# Patient Record
Sex: Male | Born: 1980 | Race: White | Hispanic: No | Smoking: Former smoker
Health system: Southern US, Community
[De-identification: ages and names within clinical notes are randomized; demographics above are authoritative.]

## PROBLEM LIST (undated history)

## (undated) DIAGNOSIS — F32A Depression, unspecified: Secondary | ICD-10-CM

## (undated) DIAGNOSIS — F329 Major depressive disorder, single episode, unspecified: Secondary | ICD-10-CM

## (undated) HISTORY — PX: OTHER SURGICAL HISTORY: SHX169

## (undated) HISTORY — DX: Depression, unspecified: F32.A

## (undated) HISTORY — DX: Major depressive disorder, single episode, unspecified: F32.9

---

## 2009-06-16 ENCOUNTER — Ambulatory Visit: Payer: Self-pay | Admitting: Internal Medicine

## 2009-06-16 DIAGNOSIS — I10 Essential (primary) hypertension: Secondary | ICD-10-CM | POA: Insufficient documentation

## 2009-06-16 DIAGNOSIS — Z87891 Personal history of nicotine dependence: Secondary | ICD-10-CM | POA: Insufficient documentation

## 2009-06-16 DIAGNOSIS — R5381 Other malaise: Secondary | ICD-10-CM | POA: Insufficient documentation

## 2009-06-16 DIAGNOSIS — F329 Major depressive disorder, single episode, unspecified: Secondary | ICD-10-CM | POA: Insufficient documentation

## 2009-06-16 DIAGNOSIS — R5383 Other fatigue: Secondary | ICD-10-CM

## 2009-06-16 DIAGNOSIS — F3289 Other specified depressive episodes: Secondary | ICD-10-CM | POA: Insufficient documentation

## 2009-06-16 DIAGNOSIS — Z9189 Other specified personal risk factors, not elsewhere classified: Secondary | ICD-10-CM | POA: Insufficient documentation

## 2009-06-16 DIAGNOSIS — J209 Acute bronchitis, unspecified: Secondary | ICD-10-CM

## 2009-06-16 DIAGNOSIS — J309 Allergic rhinitis, unspecified: Secondary | ICD-10-CM | POA: Insufficient documentation

## 2009-06-18 LAB — CONVERTED CEMR LAB
ALT: 52 units/L (ref 0–53)
Alkaline Phosphatase: 44 units/L (ref 39–117)
BUN: 7 mg/dL (ref 6–23)
Basophils Absolute: 0 10*3/uL (ref 0.0–0.1)
Basophils Relative: 0.7 % (ref 0.0–3.0)
Bilirubin, Direct: 0.2 mg/dL (ref 0.0–0.3)
CO2: 29 meq/L (ref 19–32)
Calcium: 10.2 mg/dL (ref 8.4–10.5)
Chloride: 100 meq/L (ref 96–112)
Creatinine, Ser: 1 mg/dL (ref 0.4–1.5)
Eosinophils Relative: 5.6 % — ABNORMAL HIGH (ref 0.0–5.0)
GFR calc non Af Amer: 93.62 mL/min (ref >60)
Lymphocytes Relative: 33.4 % (ref 12.0–46.0)
MCV: 90.3 fL (ref 78.0–100.0)
Monocytes Absolute: 0.5 10*3/uL (ref 0.1–1.0)
Neutrophils Relative %: 53.2 % (ref 43.0–77.0)
Platelets: 211 10*3/uL (ref 150.0–400.0)
RBC: 5.52 M/uL (ref 4.22–5.81)
Sodium: 142 meq/L (ref 135–145)
TSH: 3.09 microintl units/mL (ref 0.35–5.50)
Total Bilirubin: 0.8 mg/dL (ref 0.3–1.2)
Total Protein: 8 g/dL (ref 6.0–8.3)
WBC: 7 10*3/uL (ref 4.5–10.5)

## 2009-09-15 ENCOUNTER — Ambulatory Visit: Payer: Self-pay | Admitting: Family

## 2009-09-15 DIAGNOSIS — J029 Acute pharyngitis, unspecified: Secondary | ICD-10-CM

## 2009-09-15 DIAGNOSIS — K219 Gastro-esophageal reflux disease without esophagitis: Secondary | ICD-10-CM | POA: Insufficient documentation

## 2009-09-15 DIAGNOSIS — J329 Chronic sinusitis, unspecified: Secondary | ICD-10-CM | POA: Insufficient documentation

## 2009-09-15 LAB — CONVERTED CEMR LAB
Albumin: 4.5 g/dL (ref 3.5–5.2)
BUN: 12 mg/dL (ref 6–23)
Calcium: 9.7 mg/dL (ref 8.4–10.5)
Chloride: 102 meq/L (ref 96–112)
Cholesterol: 156 mg/dL (ref 0–200)
Eosinophils Absolute: 0.4 10*3/uL (ref 0.0–0.7)
Eosinophils Relative: 3 % (ref 0–5)
Glucose, Bld: 122 mg/dL — ABNORMAL HIGH (ref 70–99)
HCT: 47.2 % (ref 39.0–52.0)
HDL: 36 mg/dL — ABNORMAL LOW (ref >39)
Hemoglobin: 15.9 g/dL (ref 13.0–17.0)
Lymphocytes Relative: 25 % (ref 12–46)
Lymphs Abs: 2.7 10*3/uL (ref 0.7–4.0)
MCV: 85.7 fL (ref 78.0–100.0)
Monocytes Absolute: 0.5 10*3/uL (ref 0.1–1.0)
Potassium: 4.2 meq/L (ref 3.5–5.3)
RDW: 12.7 % (ref 11.5–15.5)
Rapid Strep: NEGATIVE
TSH: 2.205 microintl units/mL (ref 0.350–4.500)
Triglycerides: 171 mg/dL — ABNORMAL HIGH (ref <150)
WBC: 10.7 10*3/uL — ABNORMAL HIGH (ref 4.0–10.5)

## 2009-09-16 ENCOUNTER — Telehealth: Payer: Self-pay | Admitting: Family

## 2009-09-16 ENCOUNTER — Encounter: Payer: Self-pay | Admitting: Family

## 2009-09-16 DIAGNOSIS — R7309 Other abnormal glucose: Secondary | ICD-10-CM

## 2009-09-22 ENCOUNTER — Ambulatory Visit: Payer: Self-pay | Admitting: Family

## 2009-09-22 DIAGNOSIS — E119 Type 2 diabetes mellitus without complications: Secondary | ICD-10-CM | POA: Insufficient documentation

## 2009-09-22 LAB — CONVERTED CEMR LAB
Creatinine, Urine: 310.9 mg/dL
Microalb Creat Ratio: 6.9 mg/g (ref 0.0–30.0)

## 2009-09-23 ENCOUNTER — Encounter: Payer: Self-pay | Admitting: Family

## 2009-09-23 DIAGNOSIS — R809 Proteinuria, unspecified: Secondary | ICD-10-CM | POA: Insufficient documentation

## 2009-11-17 ENCOUNTER — Encounter: Payer: Self-pay | Admitting: Family

## 2009-12-02 ENCOUNTER — Telehealth: Payer: Self-pay | Admitting: Family

## 2009-12-03 ENCOUNTER — Ambulatory Visit: Payer: Self-pay | Admitting: Family

## 2009-12-03 LAB — CONVERTED CEMR LAB
Chloride: 102 meq/L (ref 96–112)
Creatinine, Ser: 0.87 mg/dL (ref 0.40–1.50)
Potassium: 4.1 meq/L (ref 3.5–5.3)
Sodium: 140 meq/L (ref 135–145)

## 2009-12-04 ENCOUNTER — Encounter: Payer: Self-pay | Admitting: Family

## 2009-12-29 ENCOUNTER — Ambulatory Visit: Payer: Self-pay | Admitting: Family

## 2010-03-11 ENCOUNTER — Encounter: Payer: Self-pay | Admitting: Family

## 2010-03-17 ENCOUNTER — Ambulatory Visit: Payer: Self-pay | Admitting: Family

## 2010-06-30 NOTE — Progress Notes (Signed)
Summary: f/u  Phone Note Outgoing Call   Summary of Call: Left message for patient to return my call.  When patient does call back I would like to inform him that his sugar is elevated.  He should arrange a follow up 30 minute visit please.  Initial call taken by: Lemont Fillers FNP,  September 16, 2009 3:09 PM  Follow-up for Phone Call        Patient returned my call.  Discussed lab results and new diagnosis of DM.  Please call patient the afternoon and arrange a follow up appointment (30 minutes) to discuss diabetes.   Follow-up by: Lemont Fillers FNP,  September 16, 2009 3:39 PM  Additional Follow-up for Phone Call Additional follow up Details #1::        Spoke to pt. and scheduled f/u for 09/22/09 @ 11:15.  Mervin Kung CMA  September 16, 2009 5:05 PM

## 2010-06-30 NOTE — Assessment & Plan Note (Signed)
Summary: F/U TO DISCUSS DIABETES / TF,CMA   Vital Signs:  Patient profile:   29 year old male Height:      70 inches Weight:      229.50 pounds BMI:     33.05 Temp:     98.2 degrees F oral Pulse rate:   72 / minute Pulse rhythm:   regular Resp:     12 per minute BP sitting:   130 / 90  (right arm) Cuff size:   regular  Vitals Entered By: Ricardo Rodriguez CMA (September 22, 2009 11:21 AM) CC: room 6  Follow up to discuss diabetes.   Primary Care Provider:  [name]  CC:  room 6  Follow up to discuss diabetes..  History of Present Illness: Ricardo Rodriguez is a 30 year old male who presents today for follow up of his newly diagnosed DM2.  He reports that he has gained 40 pounds in the last 2 years and that his grandfather was diabetic.  He has discontinued soda from his diet after learning that he was diabetic.  Exercises regularly (hikes).    Allergies (verified): No Known Drug Allergies  Physical Exam  General:  Well-developed,well-nourished,in no acute distress; alert,appropriate and cooperative throughout examination Lungs:  Normal respiratory effort, chest expands symmetrically. Lungs are clear to auscultation, no crackles or wheezes. Heart:  Normal rate and regular rhythm. S1 and S2 normal without gallop, murmur, click, rub or other extra sounds.   Impression & Recommendations:  Problem # 1:  DM (ICD-250.00) Assessment New 30 minutes spent with patient.  Greater than 50% of this time was spent counselling patient on diabetic care, diet, weight loss and exercise.  He was provided with educational materials on DM and DM meal planning.  Wishes to defer referral to nutrition and podiatry at this time.  Pt was counselled on signs/symtoms of hypoglycemia.  CMA instructed patient on glucose meter use. Will initiate metform and ACE.  Plan follow up in 1 month Declines referral to nutritionist.   Pt will schedule eye exam.  Wants to wait on podiatry referral.   His updated  medication list for this problem includes:    Metformin Hcl 500 Mg Tabs (Metformin hcl) ..... One tab by mouth daily for 1 week, then increase to one tablet by mouth two times a day    Lisinopril 10 Mg Tabs (Lisinopril) ..... One tab by mouth daily  Orders: T-Urine Microalbumin w/creat. ratio [id number])  Problem # 2:  ELEVATED BLOOD PRESSURE (ICD-796.2) Assessment: New BP is elevated.  Pt counselled on low sodium diet.  Will add ace for BP control and renal protection in setting of DM2.   His updated medication list for this problem includes:    Lisinopril 10 Mg Tabs (Lisinopril) ..... One tab by mouth daily  Complete Medication List: 1)  Prilosec Otc 20 Mg Tbec (Omeprazole magnesium) .... Once daily 2)  Amoxicillin 500 Mg Cap (Amoxicillin) .... Take 1 capsule by mouth three times a day x 10 days 3)  Metformin Hcl 500 Mg Tabs (Metformin hcl) .... One tab by mouth daily for 1 week, then increase to one tablet by mouth two times a day 4)  Lisinopril 10 Mg Tabs (Lisinopril) .... One tab by mouth daily  Patient Instructions: 1)  Please check your sugars once daily.   2)  Aim for fasting glucose of 80-110 and a glucose less than 150 after meals.  3)  Call if you start running sugars less than 80  and we can adjust your medication.  If you do have a low sugar- drink a glass of juice and recheck sugar in 30 minutes.   4)  Please follow up in 1 month.   Prescriptions: LISINOPRIL 10 MG TABS (LISINOPRIL) one tab by mouth daily  #30 x 1   Entered and Authorized by:   Lemont Fillers FNP   Signed by:   Lemont Fillers FNP on 09/22/2009   Method used:   [facility] to        [facility] [phone]* (retail)       [address]       [place], [place]  [address]       Ph: [phone]       Fax: [phone]   RxID:   [id number] METFORMIN HCL 500 MG TABS (METFORMIN HCL) one tab by mouth daily for 1 week, then increase to one tablet by mouth two  times a day  #60 x 1   Entered and Authorized by:   Lemont Fillers FNP   Signed by:   Lemont Fillers FNP on 09/22/2009   Method used:   [facility]ronically to        [facility] [phone]* (retail)       [address]       [place], [place]  [address]       Ph: [phone]       Fax: [phone]   RxID:   [id number]   Current Allergies (reviewed today): No known allergies

## 2010-06-30 NOTE — Letter (Signed)
   [place] at [facility] [address] [place], [place]  [address]  [place] Phone: [phone]      December 04, 2009   Xzayvion Samet [address] [place], [place] [address]  RE:  LAB RESULTS  Dear  Mr. Bail,  The following is an interpretation of your most recent lab tests.  Please take note of any instructions provided or changes to medications that have resulted from your lab work.  ELECTROLYTES:  Good - no changes needed  KIDNEY FUNCTION TESTS:  Good - no changes needed    DIABETIC STUDIES:  Good - no changes needed Blood Glucose: 122   HgbA1C: 6.6   Microalbumin/Creatinine Ratio: 6.9      Sincerely Yours,    Lemont Fillers FNP

## 2010-06-30 NOTE — Assessment & Plan Note (Signed)
Summary: FU ON NEW MED PER [name]/[facility]   Vital Signs:  Patient profile:   30 year old male Height:      70 inches Weight:      223.25 pounds BMI:     32.15 O2 Sat:      97 % on Room air Temp:     98.0 degrees F oral Pulse rate:   62 / minute Pulse rhythm:   regular Resp:     16 per minute BP sitting:   118 / 90  (right arm) Cuff size:   large  Vitals Entered By: Glendell Docker CMA (December 03, 2009 8:03 AM)  O2 Flow:  Room air CC: Rm 5-Medication Follow up  Is Patient Diabetic? No Pain Assessment Patient in pain? no      Comments Medication follow up no concerns   Primary Care Provider:  [name]  CC:  Rm 5-Medication Follow up .  History of Present Illness: Mr Ricardo Rodriguez is a 30 year old male who presents for follow up today.  1) DM- fasting sugars about 100, 140 post-prandial. Tolerating metformin.  Did note that he had one episode of hypoglycemia which occurred after hiking when he had very little food to eat.   2) HTN-  watching sodium,  has lost 6 pounds since his last visit.  Preventive Screening-Counseling & Management  Alcohol-Tobacco     Smoking Status: quit  Allergies (verified): No Known Drug Allergies  Physical Exam  General:  Well-developed,well-nourished,in no acute distress; alert,appropriate and cooperative throughout examination Head:  Normocephalic and atraumatic without obvious abnormalities. No apparent alopecia or balding. Lungs:  Normal respiratory effort, chest expands symmetrically. Lungs are clear to auscultation, no crackles or wheezes. Heart:  Normal rate and regular rhythm. S1 and S2 normal without gallop, murmur, click, rub or other extra sounds.   Impression & Recommendations:  Problem # 1:  DM (ICD-250.00) Assessment Improved Clinically stable A1C has dropped from 6.6 to 6.5.  He is at goal- will continue to monitor.   Patient instructed to make appointment for eye exam. ( + microalbuminuria- on ACE) His updated  medication list for this problem includes:    Metformin Hcl 500 Mg Tabs (Metformin hcl) ..... One tab by mouth daily for 1 week, then increase to one tablet by mouth two times a day    Lisinopril 10 Mg Tabs (Lisinopril) ..... One tab by mouth daily  Orders: T-Hgb A1C [id number])  Labs Reviewed: Creat: 0.92 (09/15/2009)    Reviewed HgBA1c results: 6.6 (09/16/2009)  Problem # 2:  ELEVATED BLOOD PRESSURE (ICD-796.2) Assessment: Improved Will add HCTZ as DBP is not yet at goal and patient has microalbuminuria His updated medication list for this problem includes:    Lisinopril 10 Mg Tabs (Lisinopril) ..... One tab by mouth daily    Hydrochlorothiazide 25 Mg Tabs (Hydrochlorothiazide) ..... One tablet by mouth daily  BP today: 118/90 Prior BP: 130/90 (09/22/2009)  Labs Reviewed: Creat: 0.92 (09/15/2009) Chol: 156 (09/15/2009)   HDL: 36 (09/15/2009)   LDL: 86 (09/15/2009)   TG: 171 (09/15/2009)  Instructed in low sodium diet (DASH Handout) and behavior modification.    Orders: TLB-BMP (Basic Metabolic Panel-BMET) (80048-METABOL)  Complete Medication List: 1)  Prilosec Otc 20 Mg Tbec (Omeprazole magnesium) .... Once daily 2)  Metformin Hcl 500 Mg Tabs (Metformin hcl) .... One tab by mouth daily for 1 week, then increase to one tablet by mouth two times a day 3)  Lisinopril 10 Mg Tabs (Lisinopril) .Marland KitchenMarland KitchenMarland Kitchen  One tab by mouth daily 4)  Hydrochlorothiazide 25 Mg Tabs (Hydrochlorothiazide) .... One tablet by mouth daily  Patient Instructions: 1)  Please schedule an eye exam.   2)  Follow up in 1 month. 3)  Complete your labs downstairs today. Prescriptions: LISINOPRIL 10 MG TABS (LISINOPRIL) one tab by mouth daily  #30 x 1   Entered and Authorized by:   Lemont Fillers FNP   Signed by:   Lemont Fillers FNP on 12/03/2009   Method used:   Electronically to        [facility] [phone]* (retail)       [address]       [place], [place]   [address]       Ph: [phone]       Fax: [phone]   RxID:   [id number] METFORMIN HCL 500 MG TABS (METFORMIN HCL) one tab by mouth daily for 1 week, then increase to one tablet by mouth two times a day  #60 x 1   Entered and Authorized by:   Lemont Fillers FNP   Signed by:   Lemont Fillers FNP on 12/03/2009   Method used:   Electronically to        [facility] [phone]* (retail)       [address]       [place], [place]  [address]       Ph: [phone]       Fax: [phone]   RxID:   [id number] HYDROCHLOROTHIAZIDE 25 MG TABS (HYDROCHLOROTHIAZIDE) one tablet by mouth daily  #30 x 1   Entered and Authorized by:   Lemont Fillers FNP   Signed by:   Lemont Fillers FNP on 12/03/2009   Method used:   Electronically to        [facility] [phone]* (retail)       [address]       [place], [place]  [address]       Ph: [phone]       Fax: [phone]   RxID:   [id number]   Current Allergies (reviewed today): No known allergies

## 2010-06-30 NOTE — Miscellaneous (Signed)
  Clinical Lists Changes  Problems: Added new problem of MICROALBUMINURIA (ICD-791.0) 

## 2010-06-30 NOTE — Progress Notes (Signed)
Summary: appt scheduled   Phone Note Outgoing Call   Summary of Call: Please call patient and arrange a follow up visit.  We need to see how he is doing wil the medication changes that were made last visit.  Initial call taken by: Lemont Fillers FNP,  December 02, 2009 8:30 AM  Follow-up for Phone Call        PATIENT APPT SCHEDULED FOR 12-03-2009 @8AM   Roselle Locus  December 02, 2009 9:43 AM

## 2010-06-30 NOTE — Letter (Signed)
Summary: Generic Letter  [place] at [facility]  [address]   [place], [place] [address]   Phone: [phone]  Fax: [phone]    03/11/2010  Ricardo Rodriguez [address] [place], [place]  [address]  Dear [name],  Please call our office to schedule a follow up appointment at your earliest convenience.  We need to follow up on your diabetes and high blood pressure.  I hope that you are well.       Sincerely,   Sandford Craze FNP  Appended Document: Generic Letter Mailed letter 03/12/10

## 2010-06-30 NOTE — Assessment & Plan Note (Signed)
Summary: FOLLOW UP  /HEA--rm 5   Vital Signs:  Patient profile:   30 year old male Height:      70 inches Weight:      230 pounds BMI:     33.12 Temp:     98.1 degrees F oral Pulse rate:   84 / minute Pulse rhythm:   regular Resp:     16 per minute BP sitting:   120 / 92  (right arm) Cuff size:   large  Vitals Entered By: Mervin Kung CMA Duncan Dull) (March 17, 2010 3:59 PM) CC: Rm 5  follow up of diabetes and high blood pressure., Hypertension Management Is Patient Diabetic? Yes Pain Assessment Patient in pain? no      Comments Pt agrees all med doses and directions are correct.  Pt needs refills on all meds. Nicki Guadalajara Fergerson CMA Duncan Dull)  March 17, 2010 4:03 PM    Primary Care Provider:  Lemont Fillers FNP  CC:  Rm 5  follow up of diabetes and high blood pressure. and Hypertension Management.  History of Present Illness: Ricardo Rodriguez is a 30 year old male who presents today for follow up.  1.DM- has been checking sugars- in AM about 100 after meals 135. Has been eating pretty well- tough when he travels.  Has increased his water.  Toleating metformin, denies hypoglycemia  2.  HTN- notes + med compliance.  Hypertension History:      Positive major cardiovascular risk factors include diabetes.  Negative major cardiovascular risk factors include male age less than 14 years old and non-tobacco-user status.    Allergies (verified): No Known Drug Allergies  Physical Exam  General:  Well-developed,well-nourished,in no acute distress; alert,appropriate and cooperative throughout examination Head:  Normocephalic and atraumatic without obvious abnormalities. No apparent alopecia or balding. Eyes:  Fundoscopic exam notes no obvious retinal hemorrhages Lungs:  Normal respiratory effort, chest expands symmetrically. Lungs are clear to auscultation, no crackles or wheezes. Heart:  Normal rate and regular rhythm. S1 and S2 normal without gallop, murmur, click, rub or other  extra sounds.  Diabetes Management Exam:    Foot Exam (with socks and/or shoes not present):       Sensory-Monofilament:          Left foot: normal          Right foot: normal       Inspection:          Left foot: normal          Right foot: normal       Nails:          Left foot: normal          Right foot: normal    Eye Exam:       Eye Exam done here today          Results: normal    Impression & Recommendations:  Problem # 1:  DM (ICD-250.00) Assessment Unchanged Check A1C, continue ACE, will add baby aspirin for cardiac protection His updated medication list for this problem includes:    Metformin Hcl 500 Mg Tabs (Metformin hcl) ..... One tab by mouth daily for 1 week, then increase to one tablet by mouth two times a day    Lisinopril 10 Mg Tabs (Lisinopril) ..... One tab by mouth daily    Aspirin Ec 81 Mg Tbec (Aspirin) ..... One tablet by mouth once daily  Orders: TLB-BMP (Basic Metabolic Panel-BMET) (80048-METABOL) T-Hgb A1C ([id number])  Labs Reviewed: Creat:  0.87 (12/03/2009)    Reviewed HgBA1c results: 6.5 (12/03/2009)  6.6 (09/16/2009)  Problem # 2:  ELEVATED BLOOD PRESSURE (ICD-796.2) Assessment: Comment Only BP control is reasonable.  Continue current meds.   His updated medication list for this problem includes:    Lisinopril 10 Mg Tabs (Lisinopril) ..... One tab by mouth daily    Hydrochlorothiazide 25 Mg Tabs (Hydrochlorothiazide) ..... One tablet by mouth daily  Orders: TLB-BMP (Basic Metabolic Panel-BMET) (80048-METABOL)  BP today: 120/92 Prior BP: 118/90 (12/03/2009)  Labs Reviewed: Creat: 0.87 (12/03/2009) Chol: 156 (09/15/2009)   HDL: 36 (09/15/2009)   LDL: 86 (09/15/2009)   TG: 171 (09/15/2009)  Instructed in low sodium diet (DASH Handout) and behavior modification.    Complete Medication List: 1)  Prilosec Otc 20 Mg Tbec (Omeprazole magnesium) .... Once daily 2)  Metformin Hcl 500 Mg Tabs (Metformin hcl) .... One tab by mouth daily  for 1 week, then increase to one tablet by mouth two times a day 3)  Lisinopril 10 Mg Tabs (Lisinopril) .... One tab by mouth daily 4)  Hydrochlorothiazide 25 Mg Tabs (Hydrochlorothiazide) .... One tablet by mouth daily 5)  Aspirin Ec 81 Mg Tbec (Aspirin) .... One tablet by mouth once daily  Hypertension Assessment/Plan:      The patient's hypertensive risk group is category C: Target organ damage and/or diabetes.  His calculated 10 year risk of coronary heart disease is 4 %.  Today's blood pressure is 120/92.  His blood pressure goal is < 130/80.   Patient Instructions: 1)  Please schedule an appointment for an eye exam.  2)  Complete your blood work downstairs. 3)  Follow up in 3 months.   Orders Added: 1)  TLB-BMP (Basic Metabolic Panel-BMET) [80048-METABOL] 2)  T-Hgb A1C [[id number]] 3)  Est. Patient Level III [[address]]    Current Allergies (reviewed today): No known allergies

## 2010-06-30 NOTE — Letter (Signed)
Summary: Care Consideration Regarding Eye Exam/[employer]  Care Consideration Regarding Eye Exam/[employer]   Imported By: Lanelle Bal 12/18/2009 10:13:37  _____________________________________________________________________  External Attachment:    Type:   Image     Comment:   External Document

## 2010-06-30 NOTE — Assessment & Plan Note (Signed)
Summary: NEW TO BE EST/[facility]   Vital Signs:  Patient profile:   30 year old male Height:      70 inches Weight:      228 pounds BMI:     32.83 Temp:     98.4 degrees F oral Pulse rate:   72 / minute Pulse rhythm:   regular Resp:     12 per minute BP sitting:   130 / 84  (right arm) Cuff size:   large  Vitals Entered By: Mervin Kung CMA (September 15, 2009 10:04 AM) Is Patient Diabetic? No   Primary Care Provider:  [name]   History of Present Illness: Ricardo Rodriguez is a 30 year old male who presents today to establish care.    Preventative- last tetanus shot greater than 10 years ago.  +Exercise (hikes regularly) Diet overall good,  eats too much pizza.  Quit smoking in December.    Depression-  Lost job 2006 tried zoloft for a few months- felt "complacent" on this med, pt discontinued as a result.  Denies current depression.    Allergies (verified): No Known Drug Allergies  Past History:  Past Medical History: Last updated: 06/16/2009 Depression Allergic rhinitis  Past Surgical History: Last updated: 06/16/2009 none  Family History: Last updated: 09/15/2009 Family History of Alcoholism/Addiction (grandparent) Family History Diabetes 1st degree relative (grandparent) HTN--maternal grandfather  Mom- living healthy (smoker) Dad- living healthy Sister- healthy, chronic headaches, ?TIA No children - wife is pregnant  Social History: Last updated: 09/15/2009 Former Smoker, quit dec 2010 married, lives with spouse who is pregnant 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  Risk Factors: Smoking Status: quit (06/16/2009)  Family History: Reviewed history from 06/16/2009 and no changes required. Family History of Alcoholism/Addiction (grandparent) Family History Diabetes 1st degree relative (grandparent) HTN--maternal grandfather  Mom- living healthy (smoker) Dad- living healthy Sister-  healthy, chronic headaches, ?TIA No children - wife is pregnant  Social History: Reviewed history from 06/16/2009 and no changes required. 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  Review of Systems       Constitutional: Denies Fever ENT:  + nasal congestion (green) x 1 week, using neti pot.  Resp: Denies cough CV:  Denies Chest Pain GI:  Denies nausea or vomitting (vomitted x 1 2 weeks ago during a headache- none since) GU: Denies dysuria Lymphatic: Denies lymphadenopathy Musculoskeletal:  Denies muscle/joint pain Skin:  Denies Rashes Psychiatric: Denies current depression, denies anxiety Neuro: Denies numbness     Physical Exam  General:  Well-developed,well-nourished,in no acute distress; alert,appropriate and cooperative throughout examination Head:  Normocephalic and atraumatic without obvious abnormalities. No apparent alopecia or balding. Eyes:  PERRLA Ears:  External ear exam shows no significant lesions or deformities.  Otoscopic examination reveals clear canals, tympanic membranes are intact bilaterally without bulging, retraction, inflammation or discharge. Hearing is grossly normal bilaterally. Neck:  No deformities, masses, or tenderness noted. Lungs:  Normal respiratory effort, chest expands symmetrically. Lungs are clear to auscultation, no crackles or wheezes. Heart:  Normal rate and regular rhythm. S1 and S2 normal without gallop, murmur, click, rub or other extra sounds. Abdomen:  Bowel sounds positive,abdomen soft and non-tender without masses, organomegaly or hernias noted. Genitalia:  Testes bilaterally descended without nodularity, tenderness or masses. No scrotal masses or lesions. No penis lesions  or urethral discharge.circumcised.   Msk:  No deformity or scoliosis noted of thoracic or lumbar spine.   Neurologic:  No cranial  nerve deficits noted. Station and gait are normal. Plantar reflexes are down-going bilaterally. DTRs are symmetrical throughout. Sensory, motor and coordinative functions appear intact. Skin:  Intact without suspicious lesions or rashes Cervical Nodes:  No lymphadenopathy noted Psych:  Cognition and judgment appear intact. Alert and cooperative with normal attention span and concentration. No apparent delusions, illusions, hallucinations   Impression & Recommendations:  Problem # 1:  PREVENTIVE HEALTH CARE (ICD-V70.0) Assessment New Patient exercises regularly.  Pt was counselled on healthy eating and weight loss.  Plan for fasting lab work today.   Orders: T-Comprehensive Metabolic Panel [id number]) T-CBC w/Diff [phone] [phone]) T-TSH [id number]) T-Lipid Profile [phone])  Problem # 2:  SINUSITIS (ICD-473.9) Assessment: New Rapid strep is negative. Symptoms consistent with sinusitus.  Plan to treat with amoxicillin.  His updated medication list for this problem includes:    Amoxicillin 500 Mg Cap (Amoxicillin) .Marland Kitchen... Take 1 capsule by mouth three times a day x 10 days  Problem # 3:  GERD (ICD-530.81) Assessment: Improved Pt reports that GERD symptoms are well controlled on prilosec OTC, continue same. His updated medication list for this problem includes:    Prilosec Otc 20 Mg Tbec (Omeprazole magnesium) ..... Once daily  Complete Medication List: 1)  Prilosec Otc 20 Mg Tbec (Omeprazole magnesium) .... Once daily 2)  Amoxicillin 500 Mg Cap (Amoxicillin) .... Take 1 capsule by mouth three times a day x 10 days  Other Orders: Rapid Strep ([id number])  Patient Instructions: 1)  Please follow up in 6 months. 2)  Complete your lab work today. Prescriptions: AMOXICILLIN 500 MG CAP (AMOXICILLIN) Take 1 capsule by mouth three times a day X 10 days  #30 x 0   Entered and Authorized by:   Lemont Fillers FNP   Signed by:   Lemont Fillers FNP on 09/15/2009   Method used:    [facility] to        [facility] [phone]* (retail)       [address]       [place], [place]  [address]       Ph: [phone]       Fax: [phone]   RxID:   [id number]   Current Allergies (reviewed today): No known allergies      Vital Signs:  Patient Profile:   30 year old male Height:     70 inches (175.26 cm) Weight:      228 pounds BMI:     32.83 Temp:     98.4 degrees F oral Pulse rate:   72 / minute Pulse rhythm:   regular Resp:     12 per minute BP sitting:   130 / 84 Cuff size:   large                 Laboratory Results    Other Tests  Rapid Strep: negative  Kit Test Internal QC: Positive   (Normal Range: Negative)

## 2010-06-30 NOTE — Assessment & Plan Note (Signed)
Summary: NEW [employer] PT--COUGHING UP GREEN STUFF-SNEEZING -PKG/OFF--STC   Vital Signs:  Patient profile:   30 year old male Height:      69 inches (175.26 cm) Weight:      230.0 pounds (104.55 kg) BMI:     34.09 O2 Sat:      98 % on Room air Temp:     98.9 degrees F (37.17 degrees C) oral Pulse rate:   81 / minute BP sitting:   140 / 110  (left arm) Cuff size:   large  Vitals Entered By: Orlan Leavens (June 16, 2009 3:51 PM)  O2 Flow:  Room air CC: New patient/ Pt states he had flu month ago. still having ongoing cough, nasal and chest congestion. fatigue. Been taking nyquil at night, tylenol cold.  Is Patient Diabetic? No Pain Assessment Patient in pain? no        Primary Care Provider:  [name]  CC:  New patient/ Pt states he had flu month ago. still having ongoing cough, nasal and chest congestion. fatigue. Been taking nyquil at night, and tylenol cold. Marland Kitchen  History of Present Illness: new pt to me and our practice - here to est care  c/o feeling ill precipiatated by flu infection (self diagnosed) onset of symptoms was 1 month ago - fever and malaise initially, dry cough-  this gradually improved w/o intervention but has had resurgence of new ill symptoms in past 2 weeks symptoms now decribed as nasal congestion and yellow discharge (from nose and chest sputum) a/w generalized fatigue no sore throat, no headache, no rash symptoms not improved with use of OTC meds like nyquil and tylenol cold +seasonal allergies - but not usually in this time of year - sneezing and runny nose "all the time"  concerned about high BP readings -  denies HA or vision changes, no unilateral weakness  +15 lb weight gain in past 6 weeks since quitting smoking  Preventive Screening-Counseling & Management  Alcohol-Tobacco     Smoking Status: quit  Current Medications (verified): 1)  Prilosec Otc 20 Mg Tbec (Omeprazole Magnesium) .... Once Daily  Allergies (verified): No  Known Drug Allergies  Past History:  Past Medical History: Depression Allergic rhinitis  Past Surgical History: none  Family History: Family History of Alcoholism/Addiction (grandparent) Family History Diabetes 1st degree relative (grandparent)  Social History: Former Smoker, quit dec 2010 married, lives with spouse who is pregnant works as Environmental manager social alcohol use Smoking Status:  quit  Review of Systems       see HPI above. I have reviewed all other systems and they were negative.   Physical Exam  General:  alert, well-developed, well-nourished, and cooperative to examination.    Eyes:  vision grossly intact; pupils equal, round and reactive to light.  conjunctiva and lids normal.    Ears:  normal pinnae bilaterally, without erythema, swelling, or tenderness to palpation. TMs clear, without effusion, or cerumen impaction. Hearing grossly normal bilaterally  Nose:  no external deformity, no external erythema, no active bleeding or clots, no sinus percussion tenderness, and clear nasal discharge with mucosal pallor.   Mouth:  teeth and gums in good repair; mucous membranes moist, without lesions or ulcers. oropharynx clear without exudate, mild erythema, +PND.  Lungs:  normal respiratory effort, no intercostal retractions or use of accessory muscles; few rhonchil breath sounds bilaterally - no crackles and no wheezes.    Heart:  normal rate, regular rhythm, no murmur, and no rub. BLE  without edema. Msk:  No deformity or scoliosis noted of thoracic or lumbar spine.   Neurologic:  alert & oriented X3 and cranial nerves II-XII symetrically intact.  strength normal in all extremities, sensation intact to light touch, and gait normal. speech fluent without dysarthria or aphasia; follows commands with good comprehension.  Skin:  no rashes, vesicles, ulcers, or erythema. No nodules or irregularity to palpation.  Psych:  Oriented X3, memory intact for recent and remote, normally  interactive, good eye contact, not anxious appearing, not depressed appearing, and not agitated.   dysphoric affect.     Impression & Recommendations:  Problem # 1:  ACUTE BRONCHITIS (ICD-466.0) cover for atypicals -  afeb and normal O2 - congrats on being done smoking! Orders: Prescription Created Electronically [phone])  His updated medication list for this problem includes:    Azithromycin 250 Mg Tabs (Azithromycin) .Marland Kitchen... 2 tabs by mouth today, then 1 by mouth daily starting tomorrow  Problem # 2:  FATIGUE (ICD-780.79) like residula from infx - check labs r/o underlying med dz, esp with recent weight gain Orders: TLB-BMP (Basic Metabolic Panel-BMET) (80048-METABOL) TLB-CBC Platelet - w/Differential (85025-CBCD) TLB-Hepatic/Liver Function Pnl (80076-HEPATIC) TLB-TSH (Thyroid Stimulating Hormone) (84443-TSH)  Problem # 3:  ELEVATED BLOOD PRESSURE (ICD-796.2) manual recheck 140/100 - ?exac by recent decongest use - laso noted weight gain and dec phys activity  unknonw FH - may be genetic component treat for infx, minimize decongest use and recheck in 6-8 weeks to consider initiating med tx if still high labs as ordered above  BP today: 140/110  Instructed in low sodium diet (DASH Handout) and behavior modification.    Problem # 4:  ALLERGIC RHINITIS (ICD-477.9)  His updated medication list for this problem includes:    Claritin 10 Mg Tabs (Loratadine) .Marland Kitchen... 1 by mouth once daily  Complete Medication List: 1)  Prilosec Otc 20 Mg Tbec (Omeprazole magnesium) .... Once daily 2)  Azithromycin 250 Mg Tabs (Azithromycin) .... 2 tabs by mouth today, then 1 by mouth daily starting tomorrow 3)  Claritin 10 Mg Tabs (Loratadine) .Marland Kitchen.. 1 by mouth once daily  Patient Instructions: 1)  it was good to see you today.  2)  congrats on being done smoking! keep it up! 3)  antibiotics for your cough and congestion symptoms - Zpack - your prescription has been electronically submitted to your  pharmacy. Please take as directed. Contact our office if you believe you're having problems with the medication(s).  4)  also add daily Claritin to your regimen even though this is not the usual time of year for your allergies! 5)  test(s) ordered today - your results will be posted on the phone tree for review in 48-72 hours from the time of test completion; call [phone] and enter your 9 digit MRN (listed above on this page, just below your name); if any changes need to be made or there are abnormal results, you will be contacted directly.  6)  ok to take OTC medications for your cold symptoms but try to avoid medications that "decongest" as this may aggrevate your blood pressure 7)  Please schedule a follow-up appointment in 6-8 weeks to recheck blood pressure, sooner if problems.  Prescriptions: AZITHROMYCIN 250 MG TABS (AZITHROMYCIN) 2 tabs by mouth today, then 1 by mouth daily starting tomorrow  #6 x 0   Entered and Authorized by:   [name]   Signed by:   [name] on 06/16/2009   Method used:  Electronically to        [facility] [phone]* (retail)       [address]       [place], [place]  [address]       Ph: [phone]       Fax: [phone]   RxID:   [id number]

## 2010-06-30 NOTE — Progress Notes (Signed)
Summary: added Hgb A1C  Phone Note Outgoing Call   Summary of Call: Could we pls add on A1C to yesterdays labs (790.29) Initial call taken by: Lemont Fillers FNP,  September 16, 2009 11:14 AM  Follow-up for Phone Call        Spoke with Marylene Land @ [facility], [phone] and test has been added.  Mervin Kung CMA  September 16, 2009 12:03 PM   New Problems: HYPERGLYCEMIA, FASTING (ICD-790.29)   New Problems: HYPERGLYCEMIA, FASTING (ICD-790.29)

## 2010-10-30 ENCOUNTER — Encounter: Payer: Self-pay | Admitting: Family

## 2011-03-31 ENCOUNTER — Ambulatory Visit: Payer: Self-pay | Admitting: Family

## 2011-04-02 ENCOUNTER — Encounter: Payer: Self-pay | Admitting: Family

## 2011-04-02 ENCOUNTER — Ambulatory Visit (INDEPENDENT_AMBULATORY_CARE_PROVIDER_SITE_OTHER): Payer: Managed Care, Other (non HMO) | Admitting: Family

## 2011-04-02 ENCOUNTER — Other Ambulatory Visit: Payer: Self-pay | Admitting: Family

## 2011-04-02 VITALS — BP 126/90 | HR 90 | Temp 98.6°F | Resp 16 | Wt 215.0 lb

## 2011-04-02 DIAGNOSIS — Z23 Encounter for immunization: Secondary | ICD-10-CM

## 2011-04-02 DIAGNOSIS — R030 Elevated blood-pressure reading, without diagnosis of hypertension: Secondary | ICD-10-CM

## 2011-04-02 DIAGNOSIS — E119 Type 2 diabetes mellitus without complications: Secondary | ICD-10-CM

## 2011-04-02 LAB — BASIC METABOLIC PANEL
CO2: 27 mEq/L (ref 19–32)
Chloride: 105 mEq/L (ref 96–112)
Sodium: 142 mEq/L (ref 135–145)

## 2011-04-02 LAB — HEMOGLOBIN A1C: Mean Plasma Glucose: 128 mg/dL — ABNORMAL HIGH (ref <117)

## 2011-04-02 MED ORDER — LISINOPRIL 10 MG PO TABS: 10.0000 mg | ORAL_TABLET | Freq: Every day | ORAL | Status: DC

## 2011-04-02 NOTE — Patient Instructions (Signed)
Please complete your blood work prior to leaving today.  Follow up in 3 months.

## 2011-04-02 NOTE — Assessment & Plan Note (Signed)
He has not been compliant with meds. Flu shot given today. Plan pneumovax next visit.  Check A1C, resume metformin if A1C >7.0.

## 2011-04-02 NOTE — Progress Notes (Signed)
  Subjective:    Patient ID: Ricardo Rodriguez, male    DOB: [birth year], 30 y.o.   MRN: [id number]  HPI  Ricardo Rodriguez is a 30 yr old male who presents today with report of recent headache. He reports that last week he had a throbbing headache which is accompanied by nausea, vomitting.  Symptoms resolved completely.  Now wife is sick.    Dm2- pt is off of all meds.  Tells me that his dad was recently diagnosed with diabetes and renal failure.  He tells me that he has been working on a diabetic diet.    Review of Systems See HPI  No past medical history on file.  History   Social History  . Marital Status: Married    Spouse Name: N/A    Number of Children: N/A  . Years of Education: N/A   Occupational History  . Not on file.   Social History Main Topics  . Smoking status: Former Smoker    Quit date: 05/31/2008  . Smokeless tobacco: Never Used  . Alcohol Use: Not on file  . Drug Use: Not on file  . Sexually Active: Not on file   Other Topics Concern  . Not on file   Social History Narrative  . No narrative on file    No past surgical history on file.  No family history on file.  No Known Allergies  No current outpatient prescriptions on file prior to visit.    BP 126/90  Pulse 90  Temp(Src) 98.6 F (37 C) (Oral)  Resp 16  Wt 215 lb (97.523 kg)       Objective:   Physical Exam  Constitutional: He appears well-developed and well-nourished. No distress.  HENT:  Head: Normocephalic and atraumatic.  Cardiovascular: Normal rate and regular rhythm.   No murmur heard. Pulmonary/Chest: Effort normal and breath sounds normal. No respiratory distress. He has no wheezes. He has no rales. He exhibits no tenderness.  Musculoskeletal:       See diabetic foot exam.  Psychiatric: He has a normal mood and affect. His behavior is normal. Judgment and thought content normal.          Assessment & Plan:  Recent viral illness- resolved.

## 2011-04-02 NOTE — Assessment & Plan Note (Signed)
BP is not at goal.  Had microalbuminemia last year.  He stopped his BP meds.  I advised pt to restart aspirin and lisinopril.

## 2011-04-16 ENCOUNTER — Telehealth: Payer: Self-pay | Admitting: Family

## 2011-04-16 NOTE — Telephone Encounter (Signed)
Could you pls check with [facility] if he completed the lab work that was ordered on 11/2.  If not, please call patient and request that he return to complete lab work.

## 2011-04-19 NOTE — Telephone Encounter (Signed)
Spoke to [place] and she will fax results from 04/02/11.

## 2011-04-20 ENCOUNTER — Encounter: Payer: Self-pay | Admitting: Family

## 2011-04-20 NOTE — Telephone Encounter (Signed)
Results received and forwarded to Provider for review.  Please advise. 

## 2011-04-21 ENCOUNTER — Encounter: Payer: Self-pay | Admitting: Family

## 2011-05-14 ENCOUNTER — Ambulatory Visit: Payer: Managed Care, Other (non HMO) | Admitting: Family

## 2011-05-21 ENCOUNTER — Encounter: Payer: Self-pay | Admitting: *Deleted

## 2011-05-21 ENCOUNTER — Encounter: Payer: Self-pay | Admitting: Family

## 2011-05-21 ENCOUNTER — Ambulatory Visit (INDEPENDENT_AMBULATORY_CARE_PROVIDER_SITE_OTHER): Payer: Managed Care, Other (non HMO) | Admitting: Family

## 2011-05-21 ENCOUNTER — Telehealth: Payer: Self-pay | Admitting: *Deleted

## 2011-05-21 VITALS — BP 128/80 | HR 82 | Temp 98.1°F | Resp 18 | Wt 205.0 lb

## 2011-05-21 DIAGNOSIS — I10 Essential (primary) hypertension: Secondary | ICD-10-CM

## 2011-05-21 DIAGNOSIS — R030 Elevated blood-pressure reading, without diagnosis of hypertension: Secondary | ICD-10-CM

## 2011-05-21 MED ORDER — LISINOPRIL 10 MG PO TABS: 10.0000 mg | ORAL_TABLET | Freq: Every day | ORAL | Status: DC

## 2011-05-21 NOTE — Patient Instructions (Addendum)
Please complete your blood work prior to leaving.  Follow up in 3 months. 

## 2011-05-21 NOTE — Telephone Encounter (Signed)
Same Day Abstraction. 

## 2011-05-21 NOTE — Progress Notes (Signed)
  Subjective:    Patient ID: Ricardo Rodriguez, male    DOB: February 05, 1981, 30 y.o.   MRN: [id number]  HPI  Ricardo Rodriguez is a 30 yr old male who presents today for follow up of his HTN.  Last visit he was placed on lisinopril.  He denies CP, SOB or LE edema.  Since starting the lisinopril he reports that he feels good. He is running regularly and has been running further distances recently.  Tolerating lisinopril without cough.     Review of Systems See HPI  Past Medical History  Diagnosis Date  . Depression   . Allergic rhinitis     History   Social History  . Marital Status: Married    Spouse Name: N/A    Number of Children: N/A  . Years of Education: N/A   Occupational History  . photographer    Social History Main Topics  . Smoking status: Former Smoker    Quit date: 05/31/2008  . Smokeless tobacco: Never Used  . Alcohol Use: Yes     social use - rare ETO  . Drug Use: Not on file  . Sexually Active: Not on file   Other Topics Concern  . Not on file   Social History Narrative   Married, lives w/spouse [pregnant].Works as Art therapist.    Past Surgical History  Procedure Date  . Unremarkable     Family History  Problem Relation Age of Onset  . Alcohol abuse Other     grandparent  . Diabetes Other     grandparent  . Hypertension Maternal Grandfather   . Other Mother     tobacco abuse  . Migraines Sister   . Transient ischemic attack Sister     No Known Allergies  No current outpatient prescriptions on file prior to visit.    BP 128/80  Pulse 82  Temp(Src) 98.1 F (36.7 C) (Oral)  Resp 18  Wt 205 lb (92.987 kg)  SpO2 98%       Objective:   Physical Exam  Constitutional: He appears well-developed and well-nourished. No distress.  Cardiovascular: Normal rate and regular rhythm.   No murmur heard. Pulmonary/Chest: Effort normal and breath sounds normal. No respiratory distress. He has no wheezes. He has no rales. He exhibits no  tenderness.  Musculoskeletal: He exhibits no edema.  Skin: Skin is warm and dry. No erythema.  Psychiatric: He has a normal mood and affect. His behavior is normal. Judgment and thought content normal.          Assessment & Plan:

## 2011-05-22 ENCOUNTER — Encounter: Payer: Self-pay | Admitting: Family

## 2011-05-22 LAB — BASIC METABOLIC PANEL WITH GFR
CO2: 21 mEq/L (ref 19–32)
GFR, Est African American: >89 mL/min
Glucose, Bld: 86 mg/dL (ref 70–99)
Potassium: 3.5 mEq/L (ref 3.5–5.3)
Sodium: 139 mEq/L (ref 135–145)

## 2011-05-22 NOTE — Assessment & Plan Note (Signed)
Will plan to continue ACE. Obtain BMET today.  I encouraged him to keep up the good work with exercise.

## 2011-08-05 ENCOUNTER — Ambulatory Visit (INDEPENDENT_AMBULATORY_CARE_PROVIDER_SITE_OTHER): Payer: Managed Care, Other (non HMO) | Admitting: Internal Medicine

## 2011-08-05 ENCOUNTER — Encounter: Payer: Self-pay | Admitting: Internal Medicine

## 2011-08-05 VITALS — BP 100/80 | HR 70 | Temp 98.0°F | Resp 18

## 2011-08-05 DIAGNOSIS — J029 Acute pharyngitis, unspecified: Secondary | ICD-10-CM

## 2011-08-05 LAB — POCT RAPID STREP A (OFFICE): Rapid Strep A Screen: NEGATIVE

## 2011-08-05 MED ORDER — AMOXICILLIN 875 MG PO TABS: 875.0000 mg | ORAL_TABLET | Freq: Two times a day (BID) | ORAL | Status: AC

## 2011-08-05 NOTE — Progress Notes (Signed)
  Subjective:    Patient ID: Ricardo Rodriguez, male    DOB: 1981/01/24, 31 y.o.   MRN: [id number]  HPI Pt presents to clinic for evaluation of sore throat. Notes 2d h/o ST with pain on swallowing. No fever, chills, nasal congestion/drainage or difficulty with drooling or secretions. No sick exposures. No alleviating or exacerbating factors. No other complaints.  Past Medical History  Diagnosis Date  . Depression   . Allergic rhinitis    Past Surgical History  Procedure Date  . Unremarkable     reports that he quit smoking about 3 years ago. He has never used smokeless tobacco. He reports that he drinks alcohol. His drug history not on file. family history includes Alcohol abuse in his other; Diabetes in his other; Hypertension in his maternal grandfather; Migraines in his sister; Other in his mother; and Transient ischemic attack in his sister. No Known Allergies   Review of Systems see hpi     Objective:   Physical Exam  Nursing note and vitals reviewed. Constitutional: He appears well-developed and well-nourished. No distress.  HENT:  Head: Normocephalic and atraumatic.  Nose: Nose normal.  Mouth/Throat: Uvula is midline and mucous membranes are normal. Posterior oropharyngeal erythema present. No oropharyngeal exudate, posterior oropharyngeal edema or tonsillar abscesses.  Eyes: Conjunctivae are normal. No scleral icterus.  Neurological: He is alert.  Skin: He is not diaphoretic.  Psychiatric: He has a normal mood and affect.          Assessment & Plan:

## 2011-08-05 NOTE — Assessment & Plan Note (Signed)
Rapid strep obtained neg. No strep exposure and exam show mild erythema only. Suspect viral etiology currently however hold abx and begin if sx's do not improve after total duration of 7-8 days. Followup if no improvement or worsening.

## 2011-08-13 ENCOUNTER — Ambulatory Visit: Payer: Managed Care, Other (non HMO) | Admitting: Family

## 2011-09-20 ENCOUNTER — Other Ambulatory Visit: Payer: Self-pay | Admitting: Internal Medicine

## 2011-12-14 ENCOUNTER — Encounter: Payer: Self-pay | Admitting: Family

## 2011-12-14 ENCOUNTER — Other Ambulatory Visit: Payer: Self-pay | Admitting: Family

## 2011-12-14 ENCOUNTER — Ambulatory Visit (INDEPENDENT_AMBULATORY_CARE_PROVIDER_SITE_OTHER): Payer: Managed Care, Other (non HMO) | Admitting: Family

## 2011-12-14 VITALS — BP 120/90 | HR 91 | Temp 99.6°F | Resp 16 | Ht 70.0 in | Wt 217.1 lb

## 2011-12-14 DIAGNOSIS — R6882 Decreased libido: Secondary | ICD-10-CM

## 2011-12-14 DIAGNOSIS — E119 Type 2 diabetes mellitus without complications: Secondary | ICD-10-CM

## 2011-12-14 DIAGNOSIS — I10 Essential (primary) hypertension: Secondary | ICD-10-CM

## 2011-12-14 NOTE — Progress Notes (Signed)
  Subjective:    Patient ID: Ricardo Rodriguez, male    DOB: 1981-05-09, 31 y.o.   MRN: [id number]  HPI  Ricardo Rodriguez is a 31 yr old male who presents today requesting to have his testosterone level checked.  He reports that he thinks he has some low level depression which he attributes to situational stress with his job.  He is not worried about the depression at this time.  Thinks his libido may be low and tells me "my wife is just curious" (about testosterone level).  Borderline DM- pt has gained 12 pounds since last visit.  HTN- continues lisinopril.    Review of Systems See HPI  Past Medical History  Diagnosis Date  . Depression   . Allergic rhinitis     History   Social History  . Marital Status: Married    Spouse Name: N/A    Number of Children: N/A  . Years of Education: N/A   Occupational History  . photographer    Social History Main Topics  . Smoking status: Former Smoker    Quit date: 05/31/2008  . Smokeless tobacco: Never Used  . Alcohol Use: Yes     social use - rare ETO  . Drug Use: Not on file  . Sexually Active: Not on file   Other Topics Concern  . Not on file   Social History Narrative   Married, lives w/spouse [pregnant].Works as Art therapist.    Past Surgical History  Procedure Date  . Unremarkable     Family History  Problem Relation Age of Onset  . Alcohol abuse Other     grandparent  . Diabetes Other     grandparent  . Hypertension Maternal Grandfather   . Other Mother     tobacco abuse  . Migraines Sister   . Transient ischemic attack Sister     No Known Allergies  Current Outpatient Prescriptions on File Prior to Visit  Medication Sig Dispense Refill  . lisinopril (PRINIVIL,ZESTRIL) 10 MG tablet Take 1 tablet (10 mg total) by mouth daily.  30 tablet  3  . omeprazole (PRILOSEC OTC) 20 MG tablet Take 20 mg by mouth daily.          BP 120/90  Pulse 91  Temp 99.6 F (37.6 C) (Oral)  Resp 16  Ht 5\' 10"   (1.778 m)  Wt 217 lb 1.3 oz (98.467 kg)  BMI 31.15 kg/m2  SpO2 97%       Objective:   Physical Exam  Constitutional: He appears well-developed and well-nourished. No distress.  Cardiovascular: Normal rate.   No murmur heard. Pulmonary/Chest: Effort normal and breath sounds normal.  Psychiatric: He has a normal mood and affect. His behavior is normal. Judgment and thought content normal.          Assessment & Plan:

## 2011-12-14 NOTE — Assessment & Plan Note (Signed)
Obtain A1C. 

## 2011-12-14 NOTE — Assessment & Plan Note (Signed)
Check testosterone free and total.

## 2011-12-14 NOTE — Assessment & Plan Note (Signed)
Fair BP control.  Continue ACE. Check BMET.

## 2011-12-14 NOTE — Patient Instructions (Addendum)
Please complete your lab work prior to leaving. Please schedule a follow up appointment in 3 months.  

## 2011-12-15 ENCOUNTER — Telehealth: Payer: Self-pay | Admitting: Family

## 2011-12-15 DIAGNOSIS — R7989 Other specified abnormal findings of blood chemistry: Secondary | ICD-10-CM

## 2011-12-15 LAB — HEMOGLOBIN A1C
Hgb A1c MFr Bld: 6.2 % — ABNORMAL HIGH (ref <5.7)
Mean Plasma Glucose: 131 mg/dL — ABNORMAL HIGH (ref <117)

## 2011-12-15 LAB — BASIC METABOLIC PANEL
BUN: 10 mg/dL (ref 6–23)
CO2: 29 mEq/L (ref 19–32)
Glucose, Bld: 123 mg/dL — ABNORMAL HIGH (ref 70–99)
Potassium: 4.4 mEq/L (ref 3.5–5.3)
Sodium: 140 mEq/L (ref 135–145)

## 2011-12-15 LAB — TESTOSTERONE, FREE, TOTAL, SHBG: Testosterone: 185.57 ng/dL — ABNORMAL LOW (ref 300–890)

## 2011-12-15 MED ORDER — TESTOSTERONE 30 MG/ACT TD SOLN: 60.0000 mg | Freq: Every day | TRANSDERMAL | Status: DC

## 2011-12-15 NOTE — Telephone Encounter (Signed)
Pls call pt and let him know that his testosterone is low.  I think he could benefit from testosterone replacement.  I have sent axiron to pharmacy.  He should apply one pump under arms after he applies deodorant once daily.  He should not allow his wife or kids to come in contact with the medication or the area of his skin where he applied.  He should plan to follow up in 3 months.  Can lab add on PSA (v70) to yesterday's labs?  If not, please ask pt to return to lab for PSA prior to starting med.

## 2011-12-16 NOTE — Telephone Encounter (Signed)
PSA level added per Mindy at [facility]. Left message for pt to return my call.

## 2011-12-16 NOTE — Telephone Encounter (Signed)
Pt returned my call and was notified of results below. Follow up has been scheduled for 03/17/12 at 8am.

## 2011-12-17 ENCOUNTER — Telehealth: Payer: Self-pay | Admitting: *Deleted

## 2011-12-17 DIAGNOSIS — Z0000 Encounter for general adult medical examination without abnormal findings: Secondary | ICD-10-CM

## 2011-12-17 NOTE — Telephone Encounter (Signed)
Received call from lab stating they are unable to add PSA onto previous blood work as quantity was not sufficient. Spoke with pt and he will completed lab work when he picks up [employer] today. Order entered and forwarded to the lab.

## 2012-03-17 ENCOUNTER — Telehealth: Payer: Self-pay | Admitting: Family

## 2012-03-17 ENCOUNTER — Ambulatory Visit: Payer: Managed Care, Other (non HMO) | Admitting: Family

## 2012-03-17 NOTE — Telephone Encounter (Signed)
You had an appt for testosterone for this patient at 8am on 10-18.  We had to cancel the appt.  Please follow up with the patient as I have been unable to reach him.

## 2012-04-04 NOTE — Telephone Encounter (Signed)
Attempted to reach pt by phone, # not in service. Mailed contact letter.

## 2012-07-15 ENCOUNTER — Other Ambulatory Visit: Payer: Self-pay

## 2012-07-18 ENCOUNTER — Encounter: Payer: Self-pay | Admitting: Family

## 2012-07-18 ENCOUNTER — Ambulatory Visit (INDEPENDENT_AMBULATORY_CARE_PROVIDER_SITE_OTHER): Payer: Managed Care, Other (non HMO) | Admitting: Family

## 2012-07-18 VITALS — BP 126/82 | HR 76 | Temp 98.6°F | Resp 16 | Ht 70.0 in | Wt 207.1 lb

## 2012-07-18 DIAGNOSIS — E119 Type 2 diabetes mellitus without complications: Secondary | ICD-10-CM

## 2012-07-18 DIAGNOSIS — Z0000 Encounter for general adult medical examination without abnormal findings: Secondary | ICD-10-CM | POA: Insufficient documentation

## 2012-07-18 DIAGNOSIS — E291 Testicular hypofunction: Secondary | ICD-10-CM

## 2012-07-18 DIAGNOSIS — R7309 Other abnormal glucose: Secondary | ICD-10-CM

## 2012-07-18 DIAGNOSIS — I10 Essential (primary) hypertension: Secondary | ICD-10-CM

## 2012-07-18 DIAGNOSIS — R739 Hyperglycemia, unspecified: Secondary | ICD-10-CM

## 2012-07-18 DIAGNOSIS — R7989 Other specified abnormal findings of blood chemistry: Secondary | ICD-10-CM

## 2012-07-18 LAB — CBC WITH DIFFERENTIAL/PLATELET
Basophils Relative: 0 % (ref 0–1)
Lymphocytes Relative: 37 % (ref 12–46)
Lymphs Abs: 2.1 10*3/uL (ref 0.7–4.0)
MCV: 81.4 fL (ref 78.0–100.0)
Neutro Abs: 3.1 10*3/uL (ref 1.7–7.7)
Neutrophils Relative %: 54 % (ref 43–77)
Platelets: 213 10*3/uL (ref 150–400)
RBC: 5.27 MIL/uL (ref 4.22–5.81)
RDW: 13.8 % (ref 11.5–15.5)
WBC: 5.8 10*3/uL (ref 4.0–10.5)

## 2012-07-18 LAB — BASIC METABOLIC PANEL WITH GFR
CO2: 29 mEq/L (ref 19–32)
Calcium: 9.3 mg/dL (ref 8.4–10.5)
Creat: 0.85 mg/dL (ref 0.50–1.35)
Glucose, Bld: 121 mg/dL — ABNORMAL HIGH (ref 70–99)

## 2012-07-18 LAB — HEPATIC FUNCTION PANEL
ALT: 21 U/L (ref 0–53)
AST: 20 U/L (ref 0–37)
Albumin: 4.4 g/dL (ref 3.5–5.2)
Total Protein: 6.8 g/dL (ref 6.0–8.3)

## 2012-07-18 LAB — LIPID PANEL
Cholesterol: 122 mg/dL (ref 0–200)
VLDL: 14 mg/dL (ref 0–40)

## 2012-07-18 NOTE — Assessment & Plan Note (Signed)
He is working hard on [employer], exercise. Obtain A1C.

## 2012-07-18 NOTE — Assessment & Plan Note (Signed)
Pt encouraged to continue healthy diet, exercise.  Declines Tdap today. Wants to defer to next visit.  Obtain fasting lab work.

## 2012-07-18 NOTE — Assessment & Plan Note (Signed)
Pt continues lisinopril.  BP stable.

## 2012-07-18 NOTE — Progress Notes (Signed)
Subjective:    Patient ID: Ricardo Rodriguez, male    DOB: 1981/05/02, 32 y.o.   MRN: [id number]  HPI  Patient presents today for complete physical.  Immunizations: declines tetanus today.  Diet: Reports that he is eating lots fruits/veggies Exercise: jobs 3-5 days a week.   Reports mood is improved.   Low testosterone- never started axiron, but would like to start now.    Review of Systems  Constitutional: Negative for unexpected weight change.  HENT: Negative for hearing loss and congestion.   Eyes: Negative for visual disturbance.  Respiratory: Negative for cough.   Cardiovascular: Negative for chest pain.  Gastrointestinal: Negative for constipation.  Genitourinary: Negative for dysuria and frequency.  Musculoskeletal:       Occasional knee pain with running  Skin: Negative for rash.  Neurological: Negative for headaches.  Hematological: Negative for adenopathy.  Psychiatric/Behavioral:       Denies anxiety   Past Medical History  Diagnosis Date  . Depression   . Allergic rhinitis     History   Social History  . Marital Status: Married    Spouse Name: N/A    Number of Children: N/A  . Years of Education: N/A   Occupational History  . photographer    Social History Main Topics  . Smoking status: Former Smoker    Quit date: 05/31/2008  . Smokeless tobacco: Never Used  . Alcohol Use: Yes     Comment: social use - rare ETO  . Drug Use: Not on file  . Sexually Active: Not on file   Other Topics Concern  . Not on file   Social History Narrative   Married, lives w/spouse [pregnant].   Works as Technical sales engineer.          Past Surgical History  Procedure Laterality Date  . Unremarkable      Family History  Problem Relation Age of Onset  . Alcohol abuse Other     grandparent  . Diabetes Other     grandparent  . Hypertension Maternal Grandfather   . Other Mother     tobacco abuse  . Migraines Sister   . Transient ischemic  attack Sister     No Known Allergies  Current Outpatient Prescriptions on File Prior to Visit  Medication Sig Dispense Refill  . lisinopril (PRINIVIL,ZESTRIL) 10 MG tablet Take 1 tablet (10 mg total) by mouth daily.  30 tablet  3  . omeprazole (PRILOSEC OTC) 20 MG tablet Take 20 mg by mouth daily.        . Testosterone (AXIRON) 30 MG/ACT SOLN Place 60 mg onto the skin daily.  90 mL  0   No current facility-administered medications on file prior to visit.    BP 126/82  Pulse 76  Temp(Src) 98.6 F (37 C) (Oral)  Resp 16  Ht 5\' 10"  (1.778 m)  Wt 207 lb 1.9 oz (93.949 kg)  BMI 29.72 kg/m2  SpO2 99%        Objective:   Physical Exam  Physical Exam  Constitutional: He is oriented to person, place, and time. He appears well-developed and well-nourished. No distress.  HENT:  Head: Normocephalic and atraumatic.  Right Ear: Tympanic membrane and ear canal normal.  Left Ear: Tympanic membrane and ear canal normal.  Mouth/Throat: Oropharynx is clear and moist.  Eyes: Pupils are equal, round, and reactive to light. No scleral icterus.  Neck: Normal range of motion. No thyromegaly present.  Cardiovascular: Normal rate and  regular rhythm.   No murmur heard. Pulmonary/Chest: Effort normal and breath sounds normal. No respiratory distress. He has no wheezes. He has no rales. He exhibits no tenderness.  Abdominal: Soft. Bowel sounds are normal. He exhibits no distension and no mass. There is no tenderness. There is no rebound and no guarding.  Musculoskeletal: He exhibits no edema.  Lymphadenopathy:    He has no cervical adenopathy.  Neurological: He is alert and oriented to person, place, and time. He exhibits normal muscle tone. Coordination normal.  Skin: Skin is warm and dry.  Psychiatric: He has a normal mood and affect. His behavior is normal. Judgment and thought content normal.          Assessment & Plan:         Assessment & Plan:

## 2012-07-18 NOTE — Patient Instructions (Addendum)
Please complete your blood work prior to leaving.  Follow up in 1 month so we can see how your levels are on the Axiron.

## 2012-07-18 NOTE — Assessment & Plan Note (Signed)
He would like to start axiron, plan follow up in 1 month.

## 2012-07-19 LAB — MICROALBUMIN / CREATININE URINE RATIO
Creatinine, Urine: 148 mg/dL
Microalb Creat Ratio: 3.9 mg/g (ref 0.0–30.0)

## 2012-07-19 LAB — URINALYSIS, ROUTINE W REFLEX MICROSCOPIC
Bilirubin Urine: NEGATIVE
Glucose, UA: NEGATIVE mg/dL
Hgb urine dipstick: NEGATIVE
Protein, ur: NEGATIVE mg/dL

## 2012-12-07 ENCOUNTER — Other Ambulatory Visit: Payer: Self-pay

## 2012-12-21 ENCOUNTER — Telehealth: Payer: Self-pay | Admitting: Family

## 2012-12-21 NOTE — Telephone Encounter (Signed)
Please remind pt that he is due for PSA if he is still taking the testosterone.

## 2012-12-21 NOTE — Telephone Encounter (Signed)
Patient returned phone call stating that he never did start taking the testosterone.

## 2012-12-21 NOTE — Telephone Encounter (Signed)
Left message on cell# to return my call. 

## 2012-12-21 NOTE — Telephone Encounter (Signed)
Medication removed from med list.

## 2013-04-05 ENCOUNTER — Other Ambulatory Visit: Payer: Self-pay

## 2013-05-01 ENCOUNTER — Encounter: Payer: Self-pay | Admitting: Family

## 2013-05-01 ENCOUNTER — Ambulatory Visit (INDEPENDENT_AMBULATORY_CARE_PROVIDER_SITE_OTHER): Payer: Managed Care, Other (non HMO) | Admitting: Family

## 2013-05-01 VITALS — BP 122/90 | HR 93 | Temp 99.0°F | Resp 16 | Ht 70.0 in | Wt 206.1 lb

## 2013-05-01 DIAGNOSIS — J020 Streptococcal pharyngitis: Secondary | ICD-10-CM | POA: Insufficient documentation

## 2013-05-01 DIAGNOSIS — R509 Fever, unspecified: Secondary | ICD-10-CM

## 2013-05-01 DIAGNOSIS — I10 Essential (primary) hypertension: Secondary | ICD-10-CM

## 2013-05-01 MED ORDER — AMOXICILLIN 500 MG PO CAPS: 500.0000 mg | ORAL_CAPSULE | Freq: Three times a day (TID) | ORAL | Status: DC

## 2013-05-01 NOTE — Patient Instructions (Signed)
Strep Throat  Strep throat is an infection of the throat caused by a bacteria named Streptococcus pyogenes. Your caregiver may call the infection streptococcal "tonsillitis" or "pharyngitis" depending on whether there are signs of inflammation in the tonsils or back of the throat. Strep throat is most common in children aged 33 33 years during the cold months of the year, but it can occur in people of any age during any season. This infection is spread from person to person (contagious) through coughing, sneezing, or other close contact.  SYMPTOMS   · Fever or chills.  · Painful, swollen, red tonsils or throat.  · Pain or difficulty when swallowing.  · White or yellow spots on the tonsils or throat.  · Swollen, tender lymph nodes or "glands" of the neck or under the jaw.  · Red rash all over the body (rare).  DIAGNOSIS   Many different infections can cause the same symptoms. A test must be done to confirm the diagnosis so the right treatment can be given. A "rapid strep test" can help your caregiver make the diagnosis in a few minutes. If this test is not available, a light swab of the infected area can be used for a throat culture test. If a throat culture test is done, results are usually available in a day or two.  TREATMENT   Strep throat is treated with antibiotic medicine.  HOME CARE INSTRUCTIONS   · Gargle with 1 tsp of salt in 1 cup of warm water, 3 4 times per day or as needed for comfort.  · Family members who also have a sore throat or fever should be tested for strep throat and treated with antibiotics if they have the strep infection.  · Make sure everyone in your household washes their hands well.  · Do not share food, drinking cups, or personal items that could cause the infection to spread to others.  · You may need to eat a soft food diet until your sore throat gets better.  · Drink enough water and fluids to keep your urine clear or pale yellow. This will help prevent dehydration.  · Get plenty of  rest.  · Stay home from school, daycare, or work until you have been on antibiotics for 24 hours.  · Only take over-the-counter or prescription medicines for pain, discomfort, or fever as directed by your caregiver.  · If antibiotics are prescribed, take them as directed. Finish them even if you start to feel better.  SEEK MEDICAL CARE IF:   · The glands in your neck continue to enlarge.  · You develop a rash, cough, or earache.  · You cough up green, yellow-brown, or bloody sputum.  · You have pain or discomfort not controlled by medicines.  · Your problems seem to be getting worse rather than better.  SEEK IMMEDIATE MEDICAL CARE IF:   · You develop any new symptoms such as vomiting, severe headache, stiff or painful neck, chest pain, shortness of breath, or trouble swallowing.  · You develop severe throat pain, drooling, or changes in your voice.  · You develop swelling of the neck, or the skin on the neck becomes red and tender.  · You have a fever.  · You develop signs of dehydration, such as fatigue, dry mouth, and decreased urination.  · You become increasingly sleepy, or you cannot wake up completely.  Document Released: 05/14/2000 Document Revised: 05/03/2012 Document Reviewed: 07/16/2010  ExitCare® Patient Information ©2014 [employer].

## 2013-05-01 NOTE — Assessment & Plan Note (Signed)
BP Readings from Last 3 Encounters:  05/01/13 122/90  07/18/12 126/82  12/14/11 120/90  BP ok today despite not starting lisinopril.  Monitor.

## 2013-05-01 NOTE — Progress Notes (Signed)
   Subjective:    Patient ID: Ricardo Rodriguez, male    DOB: 02-14-1981, 32 y.o.   MRN: [id number]  HPI  [name] is a 32 yr old male who presents today with his wife to discuss malaise.  He reports symptoms started 1 week ago as Flu like symptomswith mild sore throat. Felt "like a cold." He then developed vomiting fever and headache.  Symptoms resolved until yesterday when he developed aches/chills/malaise/fatigue.  Both children were diagnosed with strep 2 weeks ago.   Review of Systems    see HPI  Past Medical History  Diagnosis Date  . Depression   . Allergic rhinitis     History   Social History  . Marital Status: Married    Spouse Name: N/A    Number of Children: N/A  . Years of Education: N/A   Occupational History  . photographer    Social History Main Topics  . Smoking status: Former Smoker    Quit date: 05/31/2008  . Smokeless tobacco: Never Used  . Alcohol Use: Yes     Comment: social use - rare ETO  . Drug Use: Not on file  . Sexual Activity: Not on file   Other Topics Concern  . Not on file   Social History Narrative   Married, lives w/spouse [pregnant].   Works as Technical sales engineer.          Past Surgical History  Procedure Laterality Date  . Unremarkable      Family History  Problem Relation Age of Onset  . Alcohol abuse Other     grandparent  . Diabetes Other     grandparent  . Hypertension Maternal Grandfather   . Other Mother     tobacco abuse  . Migraines Sister   . Transient ischemic attack Sister     No Known Allergies  Current Outpatient Prescriptions on File Prior to Visit  Medication Sig Dispense Refill  . omeprazole (PRILOSEC OTC) 20 MG tablet Take 20 mg by mouth daily.        Marland Kitchen lisinopril (PRINIVIL,ZESTRIL) 10 MG tablet Take 1 tablet (10 mg total) by mouth daily.  30 tablet  3   No current facility-administered medications on file prior to visit.    BP 122/90  Pulse 93  Temp(Src) 99 F (37.2 C)  (Oral)  Resp 16  Ht 5\' 10"  (1.778 m)  Wt 206 lb 1.9 oz (93.495 kg)  BMI 29.58 kg/m2  SpO2 99%    Objective:   Physical Exam  Constitutional: He is oriented to person, place, and time. He appears well-developed and well-nourished. No distress.  HENT:  Head: Normocephalic and atraumatic.  Right Ear: Tympanic membrane and ear canal normal.  Left Ear: Tympanic membrane and ear canal normal.  Mouth/Throat: Posterior oropharyngeal erythema present. No oropharyngeal exudate, posterior oropharyngeal edema or tonsillar abscesses.  Cardiovascular: Normal rate and regular rhythm.   No murmur heard. Pulmonary/Chest: Breath sounds normal. No respiratory distress. He has no wheezes. He has no rales. He exhibits no tenderness.  Lymphadenopathy:    He has no cervical adenopathy.  Neurological: He is alert and oriented to person, place, and time.  Psychiatric: He has a normal mood and affect. His behavior is normal. Judgment and thought content normal.          Assessment & Plan:

## 2013-05-01 NOTE — Progress Notes (Signed)
Pre visit review using our clinic review tool, if applicable. No additional management support is needed unless otherwise documented below in the visit note. 

## 2013-05-01 NOTE — Assessment & Plan Note (Signed)
Rx with amoxicillin.  Note provided for work.

## 2013-05-11 ENCOUNTER — Encounter: Payer: Self-pay | Admitting: Family

## 2013-05-29 ENCOUNTER — Telehealth: Payer: Self-pay | Admitting: Family

## 2013-05-29 DIAGNOSIS — Z299 Encounter for prophylactic measures, unspecified: Secondary | ICD-10-CM

## 2013-05-29 MED ORDER — OSELTAMIVIR PHOSPHATE 75 MG PO CAPS: 75.0000 mg | ORAL_CAPSULE | Freq: Every day | ORAL | Status: DC

## 2013-05-29 NOTE — Telephone Encounter (Signed)
Tamiflu 75 mg once daily for 7 days [facility] [address] for [facility] prophylaxis.

## 2013-05-29 NOTE — Telephone Encounter (Signed)
Daughter tested positive for flu, [facility], wife is sick as well.

## 2013-05-29 NOTE — Telephone Encounter (Signed)
Notified pt's wife  

## 2013-06-08 ENCOUNTER — Ambulatory Visit (INDEPENDENT_AMBULATORY_CARE_PROVIDER_SITE_OTHER): Payer: Managed Care, Other (non HMO) | Admitting: Family

## 2013-06-08 ENCOUNTER — Encounter: Payer: Self-pay | Admitting: Family

## 2013-06-08 VITALS — BP 130/88 | HR 70 | Temp 97.6°F | Resp 16 | Ht 70.0 in | Wt 207.1 lb

## 2013-06-08 DIAGNOSIS — J329 Chronic sinusitis, unspecified: Secondary | ICD-10-CM | POA: Insufficient documentation

## 2013-06-08 DIAGNOSIS — I10 Essential (primary) hypertension: Secondary | ICD-10-CM

## 2013-06-08 MED ORDER — AMOXICILLIN 500 MG PO CAPS: 500.0000 mg | ORAL_CAPSULE | Freq: Three times a day (TID) | ORAL | Status: DC

## 2013-06-08 NOTE — Patient Instructions (Signed)

## 2013-06-08 NOTE — Assessment & Plan Note (Signed)
Will rx with amoxicillin. Advised pt to call if symptoms worsen or if not improved in 2-3 days and use  Neti pot.

## 2013-06-08 NOTE — Progress Notes (Signed)
   Subjective:    Patient ID: Ricardo Rodriguez, male    DOB: 1980/07/29, 33 y.o.   MRN: [id number]  HPI  Ricardo Rodriguez is a 33 yr old male who presents today with chief complaint of nasal congestion. Reports wife and daughter tested + for flu.  He recently had flu symptoms and took last dose of tamiflu.  This was prescribed by her pediatrician. Reports green nasal drainage x 1 week. Denies fever or facial pain. Nasal congestion started after the flu symptoms ended.  Review of Systems    see HPI  Past Medical History  Diagnosis Date  . Depression   . Allergic rhinitis     History   Social History  . Marital Status: Married    Spouse Name: N/A    Number of Children: N/A  . Years of Education: N/A   Occupational History  . photographer    Social History Main Topics  . Smoking status: Former Smoker    Quit date: 05/31/2008  . Smokeless tobacco: Never Used  . Alcohol Use: Yes     Comment: social use - rare ETO  . Drug Use: Not on file  . Sexual Activity: Not on file   Other Topics Concern  . Not on file   Social History Narrative   Married, lives w/spouse [pregnant].   Works as Technical sales engineerphotographer   Some college education.          Past Surgical History  Procedure Laterality Date  . Unremarkable      Family History  Problem Relation Age of Onset  . Alcohol abuse Other     grandparent  . Diabetes Other     grandparent  . Hypertension Maternal Grandfather   . Other Mother     tobacco abuse  . Migraines Sister   . Transient ischemic attack Sister     No Known Allergies  Current Outpatient Prescriptions on File Prior to Visit  Medication Sig Dispense Refill  . omeprazole (PRILOSEC OTC) 20 MG tablet Take 20 mg by mouth daily.         No current facility-administered medications on file prior to visit.    BP 130/88  Pulse 70  Temp(Src) 97.6 F (36.4 C) (Oral)  Resp 16  Ht 5\' 10"  (1.778 m)  Wt 207 lb 1.3 oz (93.931 kg)  BMI 29.71 kg/m2  SpO2 99%    Objective:    Physical Exam  Constitutional: He is oriented to person, place, and time. He appears well-developed and well-nourished. No distress.  HENT:  Head: Normocephalic and atraumatic.  Right Ear: Tympanic membrane and ear canal normal.  Mouth/Throat: No oropharyngeal exudate, posterior oropharyngeal edema or posterior oropharyngeal erythema.  L TM occluded by cerumen  Cardiovascular: Normal rate and regular rhythm.   No murmur heard. Pulmonary/Chest: Effort normal and breath sounds normal. No respiratory distress. He has no wheezes. He has no rales. He exhibits no tenderness.  Musculoskeletal: He exhibits no edema.  Lymphadenopathy:    He has no cervical adenopathy.  Neurological: He is alert and oriented to person, place, and time.  Psychiatric: He has a normal mood and affect. His behavior is normal. Judgment and thought content normal.          Assessment & Plan:

## 2013-06-08 NOTE — Progress Notes (Signed)
Pre visit review using our clinic review tool, if applicable. No additional management support is needed unless otherwise documented below in the visit note. 

## 2013-06-08 NOTE — Assessment & Plan Note (Signed)
bp stable today. 

## 2013-09-06 ENCOUNTER — Other Ambulatory Visit: Payer: Self-pay

## 2013-11-21 ENCOUNTER — Encounter: Payer: Self-pay | Admitting: Physician Assistant

## 2013-11-21 ENCOUNTER — Ambulatory Visit (INDEPENDENT_AMBULATORY_CARE_PROVIDER_SITE_OTHER): Payer: 59 | Admitting: Physician Assistant

## 2013-11-21 VITALS — BP 142/102 | HR 84 | Temp 98.7°F | Resp 16 | Ht 70.0 in | Wt 207.2 lb

## 2013-11-21 DIAGNOSIS — J029 Acute pharyngitis, unspecified: Secondary | ICD-10-CM

## 2013-11-21 DIAGNOSIS — Z136 Encounter for screening for cardiovascular disorders: Secondary | ICD-10-CM

## 2013-11-21 DIAGNOSIS — B370 Candidal stomatitis: Secondary | ICD-10-CM | POA: Insufficient documentation

## 2013-11-21 MED ORDER — NYSTATIN 100000 UNIT/ML MT SUSP
500000.0000 [IU] | Freq: Four times a day (QID) | OROMUCOSAL | Status: DC
Start: 2013-11-21 — End: 2014-09-06

## 2013-11-21 NOTE — Progress Notes (Signed)
Patient presents to clinic today c/o 4 days of sore throat with white exudate and some difficulty swallowing.  Patient endorses having strep throat a couple of months ago but endorses being treated with full resolution of symptoms.   Patient denies fever, chills, nausea/vomiting or difficulty breathing.  Denies cough or other URI symptom.   Past Medical History  Diagnosis Date  . Depression   . Allergic rhinitis     Current Outpatient Prescriptions on File Prior to Visit  Medication Sig Dispense Refill  . omeprazole (PRILOSEC OTC) 20 MG tablet Take 20 mg by mouth daily.         No current facility-administered medications on file prior to visit.    No Known Allergies  Family History  Problem Relation Age of Onset  . Alcohol abuse Other     grandparent  . Diabetes Other     grandparent  . Hypertension Maternal Grandfather   . Other Mother     tobacco abuse  . Migraines Sister   . Transient ischemic attack Sister     History   Social History  . Marital Status: Married    Spouse Name: N/A    Number of Children: N/A  . Years of Education: N/A   Occupational History  . photographer    Social History Main Topics  . Smoking status: Former Smoker    Quit date: 05/31/2008  . Smokeless tobacco: Never Used  . Alcohol Use: Yes     Comment: social use - rare ETO  . Drug Use: None  . Sexual Activity: None   Other Topics Concern  . None   Social History Narrative   Married, lives w/spouse [pregnant].   Works as Technical sales engineerphotographer   Some college education.         Review of Systems - See HPI.  All other ROS are negative.  BP 142/102  Pulse 84  Temp(Src) 98.7 F (37.1 C) (Oral)  Resp 16  Ht 5\' 10"  (1.778 m)  Wt 207 lb 4 oz (94.008 kg)  BMI 29.74 kg/m2  SpO2 98%  Physical Exam  Constitutional: He is well-developed, well-nourished, and in no distress.  HENT:  Head: Normocephalic and atraumatic.  Right Ear: External ear normal.  Left Ear: External ear normal.   Nose: Nose normal.  Mouth/Throat: Uvula is midline. Posterior oropharyngeal erythema present. No tonsillar abscesses.  White patches on an erythematous base noted on posterior oropharyngeal mucosa.  Most consistent with oral thrush.  Eyes: Conjunctivae are normal. Pupils are equal, round, and reactive to light.  Neck: Neck supple.  Cardiovascular: Normal rate, regular rhythm, normal heart sounds and intact distal pulses.   Pulmonary/Chest: Effort normal and breath sounds normal. No respiratory distress. He has no wheezes. He has no rales. He exhibits no tenderness.  Lymphadenopathy:    He has no cervical adenopathy.  Skin: Skin is warm and dry. No rash noted.  Psychiatric: Affect normal.   Assessment/Plan: Oral candidiasis Rx Nystatin oral suspension to use QID until resolution of symptoms. Increase fluids.  Tylenol or Ibuprofen to help with throat pain.  Eat soft foods. Rapid strep negative but will send for culture. Return precautions given to patient.

## 2013-11-21 NOTE — Patient Instructions (Signed)
Please use Nystatin mouthwash as directed.  Stay well hydrated.  Tylenol if needed for pain.  The tenderness and swelling will improve with the medication.  If new symptoms occur or if symptoms are not improving, please call or return to clinic.  Thrush, Adult  Ricardo Rodriguez, also called oral candidiasis, is a fungal infection that develops in the mouth and throat and on the tongue. It causes white patches to form on the mouth and tongue. Ricardo Rodriguez is most common in older adults, but it can occur at any age.  Many cases of thrush are mild, but this infection can also be more serious. Ricardo Rodriguez can be a recurring problem for people who have chronic illnesses or who take medicines that limit the body's ability to fight infection. Because these people have difficulty fighting infections, the fungus that causes thrush can spread throughout the body. This can cause life-threatening blood or organ infections. CAUSES  Ricardo Rodriguez is usually caused by a yeast called Candida albicans. This fungus is normally present in small amounts in the mouth and on other mucous membranes. It usually causes no harm. However, when conditions are present that allow the fungus to grow uncontrolled, it invades surrounding tissues and becomes an infection. Less often, other Candida species can also lead to thrush.  RISK FACTORS Ricardo Rodriguez is more likely to develop in the following people:  People with an impaired ability to fight infection (weakened immune system).   Older adults.   People with HIV.   People with diabetes.   People with dry mouth (xerostomia).   Pregnant women.   People with poor dental care, especially those who have false teeth.   People who use antibiotic medicines.  SIGNS AND SYMPTOMS  Ricardo Rodriguez can be a mild infection that causes no symptoms. If symptoms develop, they may include:   A burning feeling in the mouth and throat. This can occur at the start of a thrush infection.   White patches that adhere to  the mouth and tongue. The tissue around the patches may be red, raw, and painful. If rubbed (during tooth brushing, for example), the patches and the tissue of the mouth may bleed easily.   A bad taste in the mouth or difficulty tasting foods.   Cottony feeling in the mouth.   Pain during eating and swallowing. DIAGNOSIS  Your health care provider can usually diagnose thrush by looking in your mouth and asking you questions about your health.  TREATMENT  Medicines that help prevent the growth of fungi (antifungals) are the standard treatment for thrush. These medicines are either applied directly to the affected area (topical) or swallowed (oral). The treatment will depend on the severity of the condition.  Mild Thrush Mild cases of thrush may clear up with the use of an antifungal mouth rinse or lozenges. Treatment usually lasts about 14 days.  Moderate to Severe Thrush  More severe thrush infections that have spread to the esophagus are treated with an oral antifungal medicine. A topical antifungal medicine may also be used.   For some severe infections, a treatment period longer than 14 days may be needed.   Oral antifungal medicines are almost never used during pregnancy because the fetus may be harmed. However, if a pregnant woman has a rare, severe thrush infection that has spread to her blood, oral antifungal medicines may be used. In this case, the risk of harm to the mother and fetus from the severe thrush infection may be greater than the risk posed by the  use of antifungal medicines.  Persistent or Recurrent Thrush For cases of thrush that do not go away or keep coming back, treatment may involve the following:   Treatment may be needed twice as long as the symptoms last.   Treatment will include both oral and topical antifungal medicines.   People with weakened immune systems can take an antifungal medicine on a continuous basis to prevent thrush infections.  It is  important to treat conditions that make you more likely to get thrush, such as diabetes or HIV.  HOME CARE INSTRUCTIONS   Only take over-the-counter or prescription medicine as directed by your health care provider. Talk to your health care provider about an over-the-counter medicine called gentian violet, which kills bacteria and fungi.   Eat plain, unflavored yogurt as directed by your health care provider. Check the label to make sure the yogurt contains live cultures. This yogurt can help healthy bacteria grow in the mouth that can stop the growth of the fungus that causes thrush.   Try these measures to help reduce the discomfort of thrush:   Drink cold liquids such as water or iced tea.   Try flavored ice treats or frozen juices.   Eat foods that are easy to swallow, such as gelatin, ice cream, or custard.   If the patches in your mouth are painful, try drinking from a straw.   Rinse your mouth several times a day with a warm saltwater rinse. You can make the saltwater mixture with 1 tsp (6 g) of salt in 8 fl oz (0.2 L) of warm water.   If you wear dentures, remove the dentures before going to bed, brush them vigorously, and soak them in a cleaning solution as directed by your health care provider.   Women who are breastfeeding should clean their nipples with an antifungal medicine as directed by their health care provider. Dry the nipples after breastfeeding. Applying lanolin-containing body lotion may help relieve nipple soreness.  SEEK MEDICAL CARE IF:  Your symptoms are getting worse or are not improving within 7 days of starting treatment.   You have symptoms of spreading infection, such as white patches on the skin outside of the mouth.   You are nursing and you have redness, burning, or pain in the nipples that is not relieved with treatment.  MAKE SURE YOU:  Understand these instructions.  Will watch your condition.  Will get help right away if you are  not doing well or get worse. Document Released: 02/10/2004 Document Revised: 03/07/2013 Document Reviewed: 12/18/2012 [employer] Patient Information 2015 [employer] [place]. This information is not intended to replace advice given to you by your health care provider. Make sure you discuss any questions you have with your health care provider.

## 2013-11-21 NOTE — Addendum Note (Signed)
Addended by: Regis BillSCATES, SHARON L on: 11/21/2013 09:38 AM   Modules accepted: Orders

## 2013-11-21 NOTE — Progress Notes (Signed)
Pre visit review using our clinic review tool, if applicable. No additional management support is needed unless otherwise documented below in the visit note/SLS  

## 2013-11-21 NOTE — Assessment & Plan Note (Signed)
Rx Nystatin oral suspension to use QID until resolution of symptoms. Increase fluids.  Tylenol or Ibuprofen to help with throat pain.  Eat soft foods. Rapid strep negative but will send for culture. Return precautions given to patient.

## 2014-01-16 ENCOUNTER — Telehealth: Payer: Self-pay

## 2014-01-16 NOTE — Telephone Encounter (Signed)
Diabetic bundle:  mychart message sent  Lipid and A1C need to be checked and pt needs appt with Melissa. Hasn't seen Melissa since 07-2012  Not ordering labs until we know when patient will be seen due to merger

## 2014-09-05 ENCOUNTER — Ambulatory Visit: Payer: Self-pay | Admitting: Family Medicine

## 2014-09-06 ENCOUNTER — Ambulatory Visit (INDEPENDENT_AMBULATORY_CARE_PROVIDER_SITE_OTHER): Payer: Self-pay | Admitting: Physician Assistant

## 2014-09-06 ENCOUNTER — Encounter: Payer: Self-pay | Admitting: Physician Assistant

## 2014-09-06 VITALS — BP 130/94 | HR 81 | Temp 98.4°F | Resp 16 | Ht 70.0 in | Wt 189.0 lb

## 2014-09-06 DIAGNOSIS — L03119 Cellulitis of unspecified part of limb: Secondary | ICD-10-CM | POA: Insufficient documentation

## 2014-09-06 DIAGNOSIS — L02419 Cutaneous abscess of limb, unspecified: Secondary | ICD-10-CM

## 2014-09-06 MED ORDER — MUPIROCIN CALCIUM 2 % NA OINT: 1.0000 "application " | TOPICAL_OINTMENT | Freq: Two times a day (BID) | NASAL | Status: DC

## 2014-09-06 MED ORDER — SULFAMETHOXAZOLE-TRIMETHOPRIM 800-160 MG PO TABS: 1.0000 | ORAL_TABLET | Freq: Two times a day (BID) | ORAL | Status: DC

## 2014-09-06 NOTE — Progress Notes (Signed)
Pre visit review using our clinic review tool, if applicable. No additional management support is needed unless otherwise documented below in the visit note/SLS  

## 2014-09-06 NOTE — Progress Notes (Signed)
   Patient presents to clinic today c/o one week of a tender and draining region of left lower extremity, associated with redness of the leg. Patient denies trauma or injury. Denies known bite. Endorses 2 days ago, the area "popped" and has been draining pus since. Denies fever, chills or malaise. Does endorse possible history of MRSA infection previously.  Past Medical History  Diagnosis Date  . Depression   . Allergic rhinitis     No current outpatient prescriptions on file prior to visit.   No current facility-administered medications on file prior to visit.    No Known Allergies  Family History  Problem Relation Age of Onset  . Alcohol abuse Other     grandparent  . Diabetes Other     grandparent  . Hypertension Maternal Grandfather   . Other Mother     tobacco abuse  . Migraines Sister   . Transient ischemic attack Sister     History   Social History  . Marital Status: Married    Spouse Name: N/A  . Number of Children: N/A  . Years of Education: N/A   Occupational History  . photographer    Social History Main Topics  . Smoking status: Former Smoker    Quit date: 05/31/2008  . Smokeless tobacco: Never Used  . Alcohol Use: Yes     Comment: social use - rare ETO  . Drug Use: Not on file  . Sexual Activity: Not on file   Other Topics Concern  . None   Social History Narrative   Married, lives w/spouse [pregnant].   Works as Technical sales engineerphotographer   Some college education.         Review of Systems - See HPI.  All other ROS are negative.  BP 130/94 mmHg  Pulse 81  Temp(Src) 98.4 F (36.9 C) (Oral)  Resp 16  Ht 5\' 10"  (1.778 m)  Wt 189 lb (85.73 kg)  BMI 27.12 kg/m2  SpO2 99%  Physical Exam  Constitutional: He is oriented to person, place, and time and well-developed, well-nourished, and in no distress.  HENT:  Head: Normocephalic and atraumatic.  Cardiovascular: Normal rate, regular rhythm, normal heart sounds and intact distal pulses.     Pulmonary/Chest: Effort normal and breath sounds normal. No respiratory distress. He has no wheezes. He has no rales. He exhibits no tenderness.  Neurological: He is alert and oriented to person, place, and time.  Skin: Skin is warm and dry.     Vitals reviewed.  Assessment/Plan: Cellulitis and abscess of leg His drain successfully on its own. With surrounding cellulitis. Concern for MRSA. Culture obtained. We'll Rx Bactrim. Rx Bactroban ointment to rid MRSA colonization. Wound care discussed. Alarm signs and symptoms discussed with patient. Follow-up in one week. Return sooner if anything worsens.

## 2014-09-06 NOTE — Patient Instructions (Signed)
Please apply the Bactroban ointment to your nasal passages as directed for 5 days. This will kill off any colonized MRSA in your nose. Take the Bactrim twice daily as directed. Applied topical bacitracin to the leg. Keep covered.  Follow-up in one week. If anything worsens, or new symptoms develop, please return to the office immediately.

## 2014-09-06 NOTE — Assessment & Plan Note (Signed)
His drain successfully on its own. With surrounding cellulitis. Concern for MRSA. Culture obtained. We'll Rx Bactrim. Rx Bactroban ointment to rid MRSA colonization. Wound care discussed. Alarm signs and symptoms discussed with patient. Follow-up in one week. Return sooner if anything worsens.

## 2014-09-07 MED ORDER — MUPIROCIN CALCIUM 2 % NA OINT: 1.0000 "application " | TOPICAL_OINTMENT | Freq: Two times a day (BID) | NASAL | Status: DC

## 2014-09-07 MED ORDER — SULFAMETHOXAZOLE-TRIMETHOPRIM 800-160 MG PO TABS: 1.0000 | ORAL_TABLET | Freq: Two times a day (BID) | ORAL | Status: DC

## 2014-09-07 NOTE — Addendum Note (Signed)
Addended by: Shelva MajesticHUNTER, STEPHEN O on: 09/07/2014 11:18 AM   Modules accepted: Orders

## 2014-09-07 NOTE — Progress Notes (Signed)
Addendum: [facility] After hours call asking for it to be sent to [facility] [place] This was completed

## 2014-09-09 LAB — WOUND CULTURE
Gram Stain: NONE SEEN
Gram Stain: NONE SEEN

## 2014-09-11 ENCOUNTER — Telehealth: Payer: Self-pay | Admitting: Family

## 2014-09-11 NOTE — Telephone Encounter (Signed)
Relation to pt: self  Call back [phone]   Reason for call:  Pt returning call regarding last visit 09/06/14.

## 2014-09-13 NOTE — Telephone Encounter (Signed)
Last Call: Notes Recorded by Pryor OchoaElisabeth A Larson, RN on 09/09/2014 at 11:01 AM Patient notified. He stated that his improvement is "remarkable, 100% improvement" Notes Recorded by Waldon MerlWilliam C Martin, PA-C on 09/09/2014 at 10:56 AM Wound culture reveals MRSA infection. Antibiotic given should be helping. If not rapid improvement with the Bactrim he is on, we will need to switch to Clindamycin.

## 2014-09-16 ENCOUNTER — Ambulatory Visit: Payer: 59 | Admitting: Physician Assistant

## 2014-09-19 ENCOUNTER — Encounter: Payer: Self-pay | Admitting: Physician Assistant

## 2014-09-19 ENCOUNTER — Telehealth: Payer: Self-pay | Admitting: Family

## 2014-09-19 NOTE — Telephone Encounter (Signed)
No charge. 

## 2014-09-19 NOTE — Telephone Encounter (Signed)
Pt was no show for follow up on 09/16/14- letter sent- charge?

## 2014-11-19 ENCOUNTER — Encounter: Payer: Self-pay | Admitting: General Practice

## 2015-05-23 ENCOUNTER — Telehealth: Payer: Self-pay | Admitting: Behavioral Health

## 2015-05-23 NOTE — Telephone Encounter (Signed)
The following gaps in care were assessed: A1C BP Microalbumin Flu  Attempted to reach patient to schedule physical with PCP. Left message for patient to return call when available.

## 2021-08-17 ENCOUNTER — Other Ambulatory Visit: Payer: Self-pay

## 2021-08-17 ENCOUNTER — Encounter (HOSPITAL_BASED_OUTPATIENT_CLINIC_OR_DEPARTMENT_OTHER): Payer: Self-pay

## 2021-08-17 ENCOUNTER — Emergency Department (HOSPITAL_BASED_OUTPATIENT_CLINIC_OR_DEPARTMENT_OTHER)
Admission: EM | Admit: 2021-08-17 | Discharge: 2021-08-17 | Disposition: A | Discharge status: Home / Self Care | Payer: Self-pay | Attending: Emergency Medicine | Admitting: Emergency Medicine

## 2021-08-17 ENCOUNTER — Emergency Department (HOSPITAL_BASED_OUTPATIENT_CLINIC_OR_DEPARTMENT_OTHER): Payer: Self-pay | Admitting: Radiology

## 2021-08-17 DIAGNOSIS — I10 Essential (primary) hypertension: Secondary | ICD-10-CM | POA: Insufficient documentation

## 2021-08-17 DIAGNOSIS — Z79899 Other long term (current) drug therapy: Secondary | ICD-10-CM | POA: Insufficient documentation

## 2021-08-17 DIAGNOSIS — R079 Chest pain, unspecified: Secondary | ICD-10-CM

## 2021-08-17 LAB — CBC
HCT: 44.6 % (ref 39.0–52.0)
Hemoglobin: 15.8 g/dL (ref 13.0–17.0)
MCH: 29.7 pg (ref 26.0–34.0)
MCHC: 35.4 g/dL (ref 30.0–36.0)
MCV: 83.8 fL (ref 80.0–100.0)
Platelets: 245 10*3/uL (ref 150–400)
RBC: 5.32 MIL/uL (ref 4.22–5.81)
RDW: 12.4 % (ref 11.5–15.5)
WBC: 9 10*3/uL (ref 4.0–10.5)
nRBC: 0 % (ref 0.0–0.2)

## 2021-08-17 LAB — TROPONIN I (HIGH SENSITIVITY)
Troponin I (High Sensitivity): 2 ng/L (ref <18)
Troponin I (High Sensitivity): 3 ng/L (ref <18)

## 2021-08-17 LAB — COMPREHENSIVE METABOLIC PANEL
ALT: 19 U/L (ref 0–44)
AST: 21 U/L (ref 15–41)
Albumin: 4.7 g/dL (ref 3.5–5.0)
Alkaline Phosphatase: 36 U/L — ABNORMAL LOW (ref 38–126)
Anion gap: 10 (ref 5–15)
BUN: 14 mg/dL (ref 6–20)
CO2: 27 mmol/L (ref 22–32)
Calcium: 10.4 mg/dL — ABNORMAL HIGH (ref 8.9–10.3)
Chloride: 102 mmol/L (ref 98–111)
Creatinine, Ser: 1.05 mg/dL (ref 0.61–1.24)
GFR, Estimated: >60 mL/min (ref >60)
Glucose, Bld: 142 mg/dL — ABNORMAL HIGH (ref 70–99)
Potassium: 3.4 mmol/L — ABNORMAL LOW (ref 3.5–5.1)
Sodium: 139 mmol/L (ref 135–145)
Total Bilirubin: 0.9 mg/dL (ref 0.3–1.2)
Total Protein: 7.8 g/dL (ref 6.5–8.1)

## 2021-08-17 LAB — LIPASE, BLOOD: Lipase: 25 U/L (ref 11–51)

## 2021-08-17 MED ORDER — AMLODIPINE BESYLATE 2.5 MG PO TABS: 2.5000 mg | ORAL_TABLET | Freq: Every day | ORAL | 0 refills | Status: DC

## 2021-08-17 NOTE — ED Provider Notes (Signed)
?[facility] ?Provider Note ? ? ?CSN: [id number] ?Arrival date & time: 08/17/21  1554 ? ?  ? ?History ? ?Chief Complaint  ?Patient presents with  ? Chest Pain  ? ? ?Ricardo Rodriguez is a 41 y.o. male. ? ? ?Chest Pain ? ?HPI: A 41 year old patient with a history of hypertension presents for evaluation of chest pain. Initial onset of pain was approximately 3-6 hours ago. The patient's chest pain is not worse with exertion. The patient's chest pain is middle- or left-sided, is not well-localized, is not described as heaviness/pressure/tightness, is not sharp and does radiate to the arms/jaw/neck. The patient does not complain of nausea and denies diaphoresis. The patient has no history of stroke, has no history of peripheral artery disease, has not smoked in the past 90 days, denies any history of treated diabetes, has no relevant family history of coronary artery disease (first degree relative at less than age 16), has no history of hypercholesterolemia and does not have an elevated BMI (>=30).  ?Patient has had some intermittent chest pain off and on for the last few days.  Patient did mention to the nurse that it sometimes gets worse when eating  Patient took his blood pressure at home this evening however and was elevated in the 200s over the 120s.  He took family members hydrochlorothiazide a couple hours before he arrived.  Patient states he has had some intermittent high blood pressure at times but has never been prescribed blood pressure medications and on recheck it had returned to normal.  He has not seen a doctor however in many years at this point ?Home Medications ?Prior to Admission medications   ?Medication Sig Start Date End Date Taking? Authorizing Provider  ?amLODipine (NORVASC) 2.5 MG tablet Take 1 tablet (2.5 mg total) by mouth daily. 08/17/21 09/16/21 Yes Linwood Dibbles, MD  ?mupirocin nasal ointment (BACTROBAN NASAL) 2 % Place 1 application into the nose 2 (two) times daily. Use 1/2  tube in each nostril twice daily for five (5) days. Gently massage nose. 09/07/14   Shelva Majestic, MD  ?sulfamethoxazole-trimethoprim (BACTRIM DS) 800-160 MG per tablet Take 1 tablet by mouth 2 (two) times daily. 09/07/14   Shelva Majestic, MD  ?   ? ?Allergies    ?Patient has no known allergies.   ? ?Review of Systems   ?Review of Systems  ?Cardiovascular:  Positive for chest pain.  ? ?Physical Exam ?Updated Vital Signs ?BP (!) 139/98   Pulse 71   Temp 98.1 ?F (36.7 ?C) (Oral)   Resp 16   Ht 1.778 m (5\' 10" )   Wt 85.7 kg   SpO2 97%   BMI 27.11 kg/m?  ?Physical Exam ?Vitals and nursing note reviewed.  ?Constitutional:   ?   General: He is not in acute distress. ?   Appearance: He is well-developed.  ?HENT:  ?   Head: Normocephalic and atraumatic.  ?   Right Ear: External ear normal.  ?   Left Ear: External ear normal.  ?Eyes:  ?   General: No scleral icterus.    ?   Right eye: No discharge.     ?   Left eye: No discharge.  ?   Conjunctiva/sclera: Conjunctivae normal.  ?Neck:  ?   Trachea: No tracheal deviation.  ?Cardiovascular:  ?   Rate and Rhythm: Normal rate and regular rhythm.  ?Pulmonary:  ?   Effort: Pulmonary effort is normal. No respiratory distress.  ?   Breath sounds:  Normal breath sounds. No stridor. No wheezing or rales.  ?Abdominal:  ?   General: Bowel sounds are normal. There is no distension.  ?   Palpations: Abdomen is soft.  ?   Tenderness: There is no abdominal tenderness. There is no guarding or rebound.  ?Musculoskeletal:     ?   General: No tenderness or deformity.  ?   Cervical back: Neck supple.  ?Skin: ?   General: Skin is warm and dry.  ?   Findings: No rash.  ?Neurological:  ?   General: No focal deficit present.  ?   Mental Status: He is alert.  ?   Cranial Nerves: No cranial nerve deficit (no facial droop, extraocular movements intact, no slurred speech).  ?   Sensory: No sensory deficit.  ?   Motor: No abnormal muscle tone or seizure activity.  ?   Coordination: Coordination  normal.  ?Psychiatric:     ?   Mood and Affect: Mood normal.  ? ? ?ED Results / Procedures / Treatments   ?Labs ?(all labs ordered are listed, but only abnormal results are displayed) ?Labs Reviewed  ?COMPREHENSIVE METABOLIC PANEL - Abnormal; Notable for the following components:  ?    Result Value  ? Potassium 3.4 (*)   ? Glucose, Bld 142 (*)   ? Calcium 10.4 (*)   ? Alkaline Phosphatase 36 (*)   ? All other components within normal limits  ?CBC  ?LIPASE, BLOOD  ?TROPONIN I (HIGH SENSITIVITY)  ?TROPONIN I (HIGH SENSITIVITY)  ? ? ?EKG ?EKG Interpretation ? ?Date/Time:  Monday August 17 2021 16:09:04 EDT ?Ventricular Rate:  72 ?PR Interval:  180 ?QRS Duration: 92 ?QT Interval:  382 ?QTC Calculation: 418 ?R Axis:   72 ?Text Interpretation: Normal sinus rhythm Normal ECG No previous ECGs available Confirmed by Linwood DibblesKnapp, Tanis Burnley [phone]([id number]) on 08/17/2021 7:23:20 PM ? ?Radiology ?DG Chest 2 View ? ?Result Date: 08/17/2021 ?CLINICAL DATA:  Chest pain EXAM: CHEST - 2 VIEW COMPARISON:  None. FINDINGS: The cardiomediastinal contours are within normal limits. The lungs are clear. No pneumothorax or pleural effusion. No acute finding in the visualized skeleton. IMPRESSION: No active cardiopulmonary disease. Electronically Signed   By: Emmaline KluverNancy  Ballantyne M.D.   On: 08/17/2021 16:51   ? ?Procedures ?Procedures  ? ? ?Medications Ordered in ED ?Medications - No data to display ? ?ED Course/ Medical Decision Making/ A&P ?  ?HEAR Score: 2                       ?Medical Decision Making ?Amount and/or Complexity of Data Reviewed ?Labs: ordered. ?Radiology: ordered. ? ?Risk ?Prescription drug management. ? ? ?Patient presented to the ED for evaluation of chest pain.  Patient overall low risk for heart disease.  Hear score of 2.  ED work-up reassuring.  Chest x-ray images and report reviewed.  No acute abnormality noted.  CBC metabolic panel lipase all normal.  Troponin are normal.  Doubt symptoms related to acute coronary syndrome.  He is low risk  for PE and PERC negative.  Patient was hypertensive at home.  He did take blood pressure medications and his blood pressure has improved.  Patient has not seen a primary care doctor for years.  I will start him on a low-dose of Norvasc and recommend outpatient follow-up with the PCP.  Warning signs precautions discussed. ? ?Evaluation and diagnostic testing in the emergency department does not suggest an emergent condition requiring admission or immediate intervention beyond what  has been performed at this time.  The patient is safe for discharge and has been instructed to return immediately for worsening symptoms, change in symptoms or any other concerns. ? ? ? ? ? ? ? ? ?Final Clinical Impression(s) / ED Diagnoses ?Final diagnoses:  ?Hypertension, unspecified type  ?Chest pain, unspecified type  ? ? ?Rx / DC Orders ?ED Discharge Orders   ? ?      Ordered  ?  amLODipine (NORVASC) 2.5 MG tablet  Daily       ? 08/17/21 2045  ? ?  ?  ? ?  ? ? ?  ?Linwood Dibbles, MD ?08/17/21 2047 ? ?

## 2021-08-17 NOTE — ED Triage Notes (Signed)
Patient here POV from Home with CP. ? ?Pain is Left-Sided and radiates Left Shoulder/Collar Bone. Worsens when eating.  ? ?Patient did take 12.5 mg of Hydrochlorothiazide 2 hours PTA.  ? ?No SOB. No N/V/D. ? ?NAD Noted during Triage. A&Ox4. GCS 15. Ambulatory.  ?

## 2021-08-17 NOTE — Discharge Instructions (Addendum)
Keep a record of your blood pressure.  Follow-up with a primary care doctor to have that rechecked.  Return as needed for worsening symptoms ?

## 2021-09-16 ENCOUNTER — Encounter: Payer: Self-pay | Admitting: Family Medicine

## 2021-09-16 ENCOUNTER — Ambulatory Visit (INDEPENDENT_AMBULATORY_CARE_PROVIDER_SITE_OTHER): Payer: Self-pay | Admitting: Family Medicine

## 2021-09-16 ENCOUNTER — Ambulatory Visit: Payer: Self-pay | Admitting: Family Medicine

## 2021-09-16 DIAGNOSIS — I10 Essential (primary) hypertension: Secondary | ICD-10-CM

## 2021-09-16 DIAGNOSIS — E781 Pure hyperglyceridemia: Secondary | ICD-10-CM | POA: Insufficient documentation

## 2021-09-16 DIAGNOSIS — Z0000 Encounter for general adult medical examination without abnormal findings: Secondary | ICD-10-CM

## 2021-09-16 DIAGNOSIS — E119 Type 2 diabetes mellitus without complications: Secondary | ICD-10-CM

## 2021-09-16 MED ORDER — AMLODIPINE BESYLATE 2.5 MG PO TABS: 2.5000 mg | ORAL_TABLET | Freq: Every day | ORAL | 3 refills | Status: DC

## 2021-09-16 NOTE — Assessment & Plan Note (Signed)
Declines hepatitis C and HIV screening.  Declines tetanus today.  Labs today.  See orders. ?

## 2021-09-16 NOTE — Assessment & Plan Note (Signed)
Stable.  Refilled amlodipine today.  Continue.  Labs today. ?

## 2021-09-16 NOTE — Progress Notes (Addendum)
? ?Subjective:  ?Patient ID: Ricardo Rodriguez, male    DOB: 06/22/1980  Age: 41 y.o. MRN: [id number] ? ?CC: ?Chief Complaint  ?Patient presents with  ? Establish Care  ?  Establish care-needs Amlodipine filled. Pt checking blood pressure at least 3 times a day. Pt tries to stay away from extra salt and sugar.   ? ? ?HPI: ? ?41 year old male with hypertension, prior history of diabetes based on prior A1c's years ago, and hypertriglyceridemia presents to establish care. ? ?Patient recently seen in the ER and was placed on amlodipine for hypertension.  BP is fairly well controlled currently.  Recommend goal less than 130/80.  Needs refill on amlodipine. ? ?Patient states that he is feeling well.  He has no complaints or concerns at this time. ? ?Patient has a documented history of elevated triglycerides as well as A1c reflective of diabetes.  Unsure of status. ? ?Discussed preventative health today.  Declines HIV and hepatitis C screening.  Declines COVID-vaccine.  Has not had a tetanus in quite some time.  States that it was in his 59s.  No recent eye exam. ? ?Denies chest pain, shortness of breath, nausea, vomiting.  No issues with going to the restroom.  No vision issues. ? ?Patient Active Problem List  ? Diagnosis Date Noted  ? Essential hypertension 09/16/2021  ? Type 2 diabetes mellitus without complications ([place]) 35/00/9381  ? Hypertriglyceridemia 09/16/2021  ? Preventative health care 09/16/2021  ? ? ?Social Hx   ?Social History  ? ?Socioeconomic History  ? Marital status: Divorced  ?  Spouse name: Not on file  ? Number of children: Not on file  ? Years of education: Not on file  ? Highest education level: Not on file  ?Occupational History  ? Occupation: Geophysicist/field seismologist  ?  Employer: [employer]   ?Tobacco Use  ? Smoking status: Former  ?  Types: Cigarettes  ?  Quit date: 05/31/2008  ?  Years since quitting: 13.3  ? Smokeless tobacco: Never  ?Substance and Sexual Activity  ? Alcohol use: Yes  ?  Comment: social  use - rare ETO  ? Drug use: Not Currently  ? Sexual activity: Not on file  ?Other Topics Concern  ? Not on file  ?Social History Narrative  ? Married, lives w/spouse [pregnant].  ? Works as Geophysicist/field seismologist  ? Some college education.  ?   ?   ? ?Social Determinants of Health  ? ?Financial Resource Strain: Not on file  ?Food Insecurity: Not on file  ?Transportation Needs: Not on file  ?Physical Activity: Not on file  ?Stress: Not on file  ?Social Connections: Not on file  ? ? ?Review of Systems ?Per HPI ? ?Objective:  ?BP 131/88   Pulse 71   Temp 97.7 ?F (36.5 ?C)   Ht 5' 8.5" (1.74 m)   Wt 196 lb 12.8 oz (89.3 kg)   SpO2 100%   BMI 29.49 kg/m?  ? ? ?  09/16/2021  ?  8:27 AM 08/17/2021  ?  9:00 PM 08/17/2021  ?  8:00 PM  ?BP/Weight  ?Systolic BP 829 937 169  ?Diastolic BP 88 678 98  ?Wt. (Lbs) 196.8    ?BMI 29.49 kg/m2    ? ? ?Physical Exam ?Vitals and nursing note reviewed.  ?Constitutional:   ?   General: He is not in acute distress. ?   Appearance: Normal appearance.  ?HENT:  ?   Head: Normocephalic and atraumatic.  ?   Right Ear: Tympanic membrane  normal.  ?   Left Ear: Tympanic membrane normal.  ?Eyes:  ?   General:     ?   Right eye: No discharge.     ?   Left eye: No discharge.  ?   Conjunctiva/sclera: Conjunctivae normal.  ?Cardiovascular:  ?   Rate and Rhythm: Normal rate and regular rhythm.  ?Pulmonary:  ?   Effort: Pulmonary effort is normal.  ?   Breath sounds: Normal breath sounds. No wheezing, rhonchi or rales.  ?Abdominal:  ?   General: There is no distension.  ?   Palpations: Abdomen is soft.  ?   Tenderness: There is no abdominal tenderness.  ?Neurological:  ?   Mental Status: He is alert.  ?Psychiatric:     ?   Mood and Affect: Mood normal.     ?   Behavior: Behavior normal.  ? ? ?Lab Results  ?Component Value Date  ? WBC 9.0 08/17/2021  ? HGB 15.8 08/17/2021  ? HCT 44.6 08/17/2021  ? PLT 245 08/17/2021  ? GLUCOSE 142 (H) 08/17/2021  ? CHOL 122 07/18/2012  ? TRIG 69 07/18/2012  ? HDL 37 (L)  07/18/2012  ? [place] 71 07/18/2012  ? ALT 19 08/17/2021  ? AST 21 08/17/2021  ? NA 139 08/17/2021  ? K 3.4 (L) 08/17/2021  ? CL 102 08/17/2021  ? CREATININE 1.05 08/17/2021  ? BUN 14 08/17/2021  ? CO2 27 08/17/2021  ? TSH 1.615 07/18/2012  ? HGBA1C 6.3 (H) 07/18/2012  ? MICROALBUR 0.58 07/18/2012  ? ? ? ?Assessment & Plan:  ? ?Problem List Items Addressed This Visit   ? ?  ? Cardiovascular and Mediastinum  ? Essential hypertension  ?  Stable.  Refilled amlodipine today.  Continue.  Labs today. ? ?  ?  ? Relevant Medications  ? amLODipine (NORVASC) 2.5 MG tablet  ?  ? Endocrine  ? Type 2 diabetes mellitus without complications ([place])  ?  Unsure of status.  Labs today. ? ?  ?  ? Relevant Orders  ? CMP14+EGFR  ? Microalbumin / creatinine urine ratio  ? Hemoglobin A1c  ?  ? Other  ? Hypertriglyceridemia  ?  Lipid panel today. ? ?  ?  ? Relevant Medications  ? amLODipine (NORVASC) 2.5 MG tablet  ? Other Relevant Orders  ? Lipid panel  ? Preventative health care  ?  Declines hepatitis C and HIV screening.  Declines tetanus today.  Labs today.  See orders. ? ?  ?  ? ? ?Meds ordered this encounter  ?Medications  ? amLODipine (NORVASC) 2.5 MG tablet  ?  Sig: Take 1 tablet (2.5 mg total) by mouth daily.  ?  Dispense:  90 tablet  ?  Refill:  3  ? ? ?Follow-up:  Return in about 6 months (around 03/18/2022). ? ?Thersa Salt DO ?[place] ? ?

## 2021-09-16 NOTE — Assessment & Plan Note (Signed)
Lipid panel today

## 2021-09-16 NOTE — Addendum Note (Signed)
Addended by: Tommie Sams on: 09/16/2021 12:12 PM ? ? Modules accepted: Level of Service ? ?

## 2021-09-16 NOTE — Assessment & Plan Note (Deleted)
Lipid panel today

## 2021-09-16 NOTE — Patient Instructions (Addendum)
Labs today. ? ?I have refilled your medication. ? ?Follow up in 6 months (pending lab results). ? ?Take care ? ?Dr. Adriana Simas  ?

## 2021-09-16 NOTE — Assessment & Plan Note (Signed)
Unsure of status.  Labs today. ?

## 2021-09-17 LAB — HEMOGLOBIN A1C
Est. average glucose Bld gHb Est-mCnc: 117 mg/dL
Hgb A1c MFr Bld: 5.7 % — ABNORMAL HIGH (ref 4.8–5.6)

## 2021-09-17 LAB — CMP14+EGFR
ALT: 21 IU/L (ref 0–44)
AST: 22 IU/L (ref 0–40)
Albumin/Globulin Ratio: 1.8 (ref 1.2–2.2)
Albumin: 4.6 g/dL (ref 4.0–5.0)
Alkaline Phosphatase: 46 IU/L (ref 44–121)
BUN/Creatinine Ratio: 13 (ref 9–20)
BUN: 13 mg/dL (ref 6–24)
Bilirubin Total: 0.7 mg/dL (ref 0.0–1.2)
CO2: 25 mmol/L (ref 20–29)
Calcium: 9.7 mg/dL (ref 8.7–10.2)
Chloride: 102 mmol/L (ref 96–106)
Creatinine, Ser: 0.97 mg/dL (ref 0.76–1.27)
Globulin, Total: 2.5 g/dL (ref 1.5–4.5)
Glucose: 130 mg/dL — ABNORMAL HIGH (ref 70–99)
Potassium: 4.3 mmol/L (ref 3.5–5.2)
Sodium: 140 mmol/L (ref 134–144)
Total Protein: 7.1 g/dL (ref 6.0–8.5)
eGFR: 101 mL/min/{1.73_m2} (ref >59)

## 2021-09-17 LAB — LIPID PANEL
Chol/HDL Ratio: 4.2 ratio (ref 0.0–5.0)
Cholesterol, Total: 175 mg/dL (ref 100–199)
HDL: 42 mg/dL (ref >39)
LDL Chol Calc (NIH): 114 mg/dL — ABNORMAL HIGH (ref 0–99)
Triglycerides: 106 mg/dL (ref 0–149)
VLDL Cholesterol Cal: 19 mg/dL (ref 5–40)

## 2021-09-17 LAB — MICROALBUMIN / CREATININE URINE RATIO
Creatinine, Urine: 130.8 mg/dL
Microalb/Creat Ratio: 2 mg/g creat (ref 0–29)
Microalbumin, Urine: 3.2 ug/mL

## 2022-03-18 ENCOUNTER — Encounter: Payer: Self-pay | Admitting: Family Medicine

## 2022-03-18 ENCOUNTER — Ambulatory Visit (INDEPENDENT_AMBULATORY_CARE_PROVIDER_SITE_OTHER): Payer: Self-pay | Admitting: Family Medicine

## 2022-03-18 DIAGNOSIS — E785 Hyperlipidemia, unspecified: Secondary | ICD-10-CM | POA: Insufficient documentation

## 2022-03-18 DIAGNOSIS — R7303 Prediabetes: Secondary | ICD-10-CM | POA: Insufficient documentation

## 2022-03-18 DIAGNOSIS — E7800 Pure hypercholesterolemia, unspecified: Secondary | ICD-10-CM

## 2022-03-18 DIAGNOSIS — I10 Essential (primary) hypertension: Secondary | ICD-10-CM

## 2022-03-18 MED ORDER — AMLODIPINE BESYLATE 2.5 MG PO TABS: 2.5000 mg | ORAL_TABLET | Freq: Every day | ORAL | 3 refills | Status: DC

## 2022-03-18 NOTE — Progress Notes (Signed)
Subjective:  Patient ID: Ricardo Rodriguez, male    DOB: Jan 19, 1981  Age: 41 y.o. MRN: [id number]  CC: Chief Complaint  Patient presents with   Hypertension    Pt arrives for follow up on HTN. Pt states no issues.     HPI:  41 year old male with HTN, HLD, Hx of diabetes presents for follow up.  Patient's most recent A1c was 5.7.  He has appeared to have reversed his diabetes with lifestyle changes.  Doing well at this time.  Staying very active and exercising at least 5 times a week.  Hypertension is well controlled.  He is on a very small dose of amlodipine.  Doing well.  Mild hyperlipidemia with an LDL of 114.  His ASCVD risk or is very low at this time.  Managing with diet and exercise.  He is feeling well and has no specific complaints today.  Patient Active Problem List   Diagnosis Date Noted   Hyperlipidemia 03/18/2022   Prediabetes 03/18/2022   Essential hypertension 09/16/2021   Preventative health care 09/16/2021    Social Hx   Social History   Socioeconomic History   Marital status: Divorced    Spouse name: Not on file   Number of children: Not on file   Years of education: Not on file   Highest education level: Not on file  Occupational History   Occupation: Programme researcher, broadcasting/film/video: [employer]   Tobacco Use   Smoking status: Former    Types: Cigarettes    Quit date: 05/31/2008    Years since quitting: 13.8   Smokeless tobacco: Never  Substance and Sexual Activity   Alcohol use: Yes    Comment: social use - rare ETO   Drug use: Not Currently   Sexual activity: Not on file  Other Topics Concern   Not on file  Social History Narrative   Married, lives w/spouse [pregnant].   Works as Radio broadcast assistant.         Social Determinants of Health   Financial Resource Strain: Not on file  Food Insecurity: Not on file  Transportation Needs: Not on file  Physical Activity: Not on file  Stress: Not on file  Social Connections:  Not on file    Review of Systems  Constitutional: Negative.   Eyes: Negative.     Objective:  BP 114/72   Pulse (!) 55   Temp 99.3 F (37.4 C)   Wt 194 lb 12.8 oz (88.4 kg)   SpO2 99%   BMI 29.19 kg/m      03/18/2022    8:11 AM 09/16/2021    8:27 AM 08/17/2021    9:00 PM  BP/Weight  Systolic BP [id number] A999333  Diastolic BP 72 88 123456  Wt. (Lbs) 194.8 196.8   BMI 29.19 kg/m2 29.49 kg/m2     Physical Exam Vitals and nursing note reviewed.  Constitutional:      General: He is not in acute distress.    Appearance: Normal appearance.  HENT:     Head: Normocephalic and atraumatic.  Cardiovascular:     Rate and Rhythm: Normal rate and regular rhythm.  Pulmonary:     Effort: Pulmonary effort is normal.     Breath sounds: Normal breath sounds.  Abdominal:     General: There is no distension.     Palpations: Abdomen is soft.     Tenderness: There is no abdominal tenderness.  Neurological:  Mental Status: He is alert.  Psychiatric:        Mood and Affect: Mood normal.        Behavior: Behavior normal.     Lab Results  Component Value Date   WBC 9.0 08/17/2021   HGB 15.8 08/17/2021   HCT 44.6 08/17/2021   PLT 245 08/17/2021   GLUCOSE 130 (H) 09/16/2021   CHOL 175 09/16/2021   TRIG 106 09/16/2021   HDL 42 09/16/2021   LDLCALC 114 (H) 09/16/2021   ALT 21 09/16/2021   AST 22 09/16/2021   NA 140 09/16/2021   K 4.3 09/16/2021   CL 102 09/16/2021   CREATININE 0.97 09/16/2021   BUN 13 09/16/2021   CO2 25 09/16/2021   TSH 1.615 07/18/2012   HGBA1C 5.7 (H) 09/16/2021   MICROALBUR 0.58 07/18/2012     Assessment & Plan:   Problem List Items Addressed This Visit       Cardiovascular and Mediastinum   Essential hypertension    Doing well.  Continue low-dose amlodipine.  Refilled today.      Relevant Medications   amLODipine (NORVASC) 2.5 MG tablet     Other   Prediabetes    Continue watching diet and regular exercise.      Hyperlipidemia    Low  ASCVD risk score.  No pharmacotherapy at this time.  Continue watching diet and regular exercise.      Relevant Medications   amLODipine (NORVASC) 2.5 MG tablet    Meds ordered this encounter  Medications   amLODipine (NORVASC) 2.5 MG tablet    Sig: Take 1 tablet (2.5 mg total) by mouth daily.    Dispense:  90 tablet    Refill:  3    Follow-up:  Return in about 1 year (around 03/19/2023).  [place]

## 2022-03-18 NOTE — Patient Instructions (Signed)
Continue your medication.  Continue to stay active.  Follow up annually.  Take care  Dr. Lacinda Axon

## 2022-03-18 NOTE — Assessment & Plan Note (Addendum)
Low ASCVD risk score.  No pharmacotherapy at this time.  Continue watching diet and regular exercise.

## 2022-03-18 NOTE — Assessment & Plan Note (Signed)
Continue watching diet and regular exercise.

## 2022-03-18 NOTE — Assessment & Plan Note (Signed)
Doing well.  Continue low-dose amlodipine.  Refilled today.

## 2022-11-10 ENCOUNTER — Other Ambulatory Visit: Payer: Self-pay | Admitting: Family Medicine

## 2022-12-20 ENCOUNTER — Other Ambulatory Visit: Payer: Self-pay | Admitting: Family Medicine

## 2022-12-20 MED ORDER — AMLODIPINE BESYLATE 2.5 MG PO TABS: 2.5000 mg | ORAL_TABLET | Freq: Every day | ORAL | 3 refills | Status: DC

## 2023-07-18 ENCOUNTER — Ambulatory Visit (INDEPENDENT_AMBULATORY_CARE_PROVIDER_SITE_OTHER): Payer: Self-pay | Admitting: Family Medicine

## 2023-07-18 DIAGNOSIS — S0592XA Unspecified injury of left eye and orbit, initial encounter: Secondary | ICD-10-CM

## 2023-07-18 NOTE — Patient Instructions (Signed)
I will finish your note with uploaded pictures as soon as I can.  If I can help, please let me know.
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
## 2023-07-18 NOTE — Assessment & Plan Note (Signed)
Overall he is recovering well.  He is experiencing some discomfort but it is mild.  Supportive care.

## 2023-09-23 IMAGING — DX DG CHEST 2V
2 series · 2 of 2 positions shown · non-contrast
Comparison: None.

CLINICAL DATA: Chest pain

EXAM:
CHEST - 2 VIEW

[chest pa]
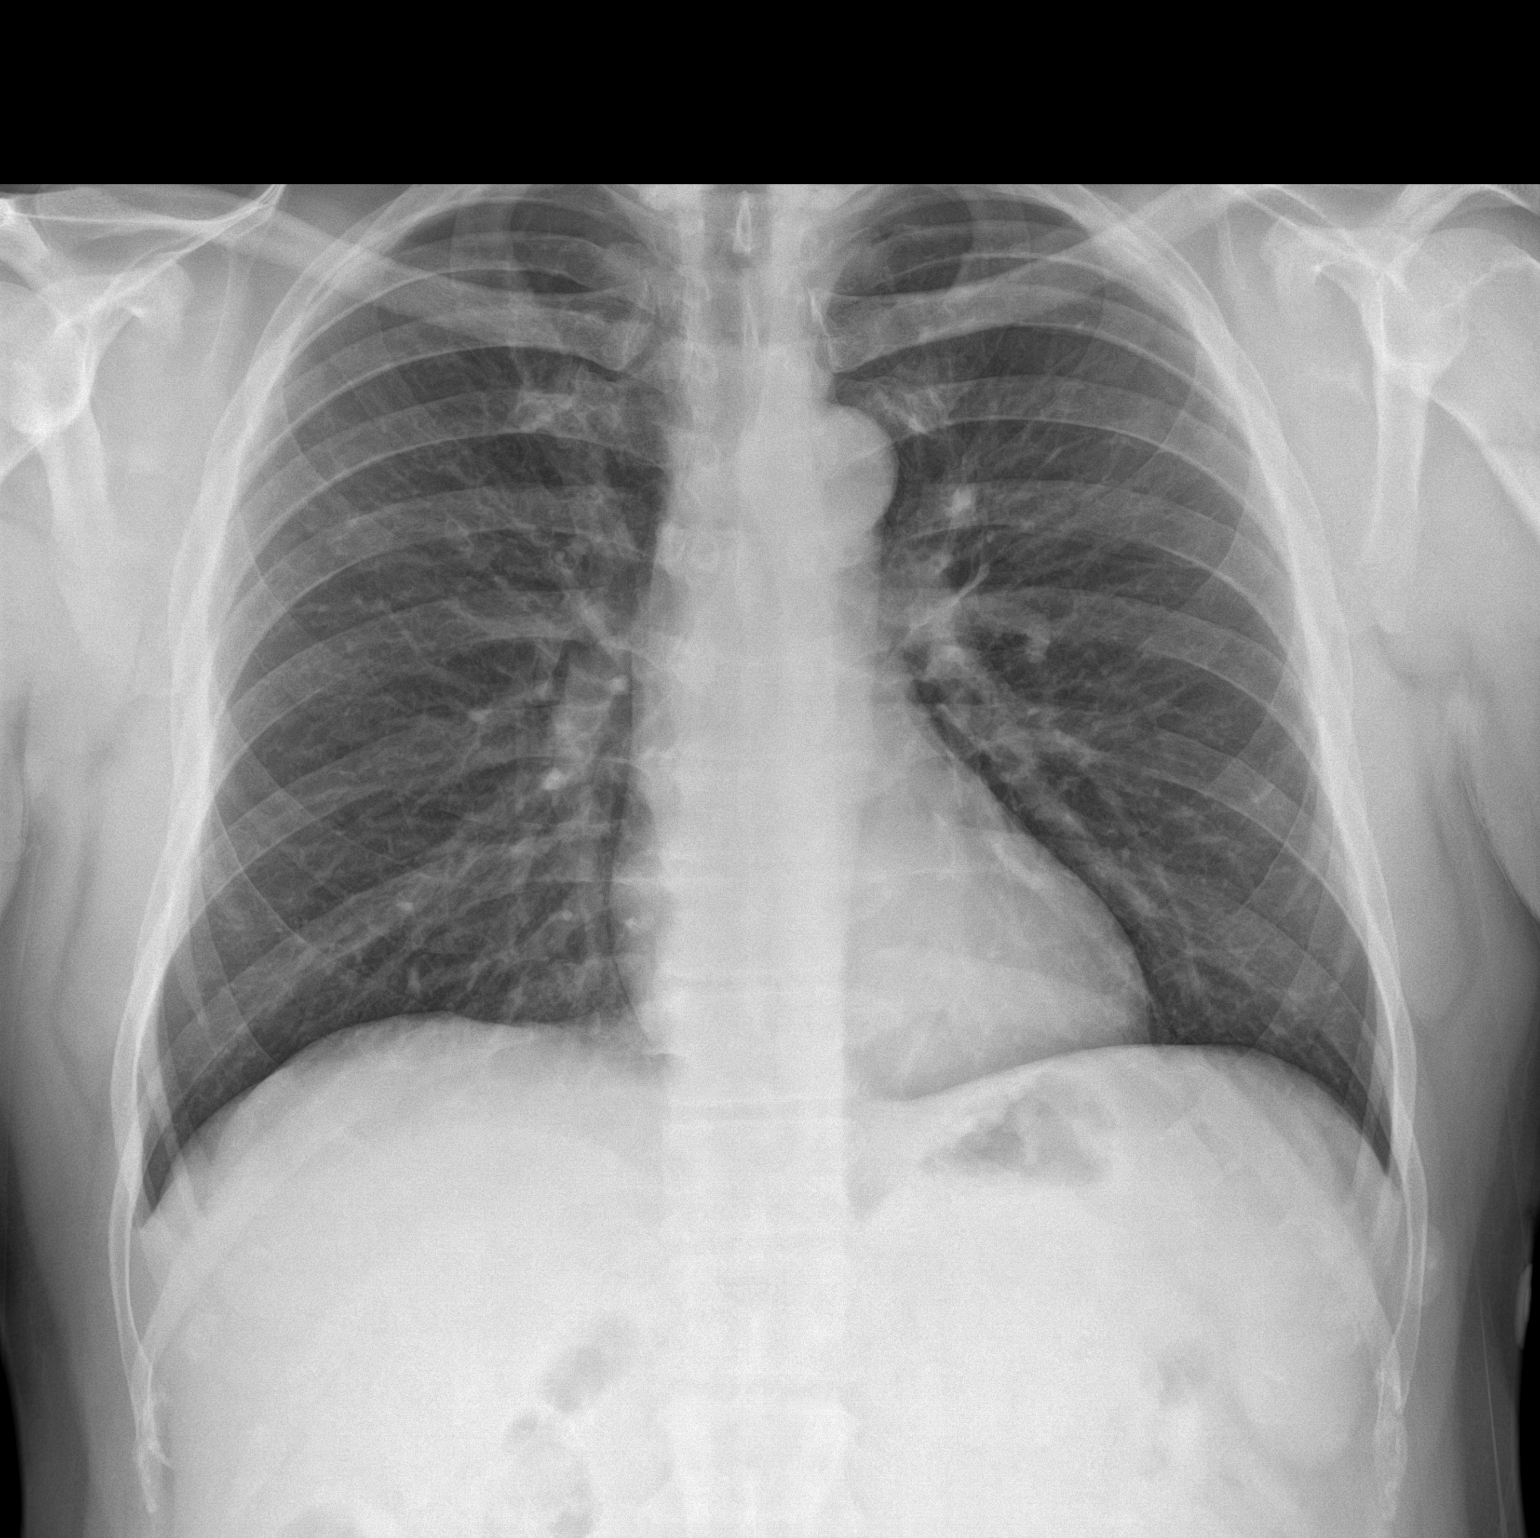

[chest lat]
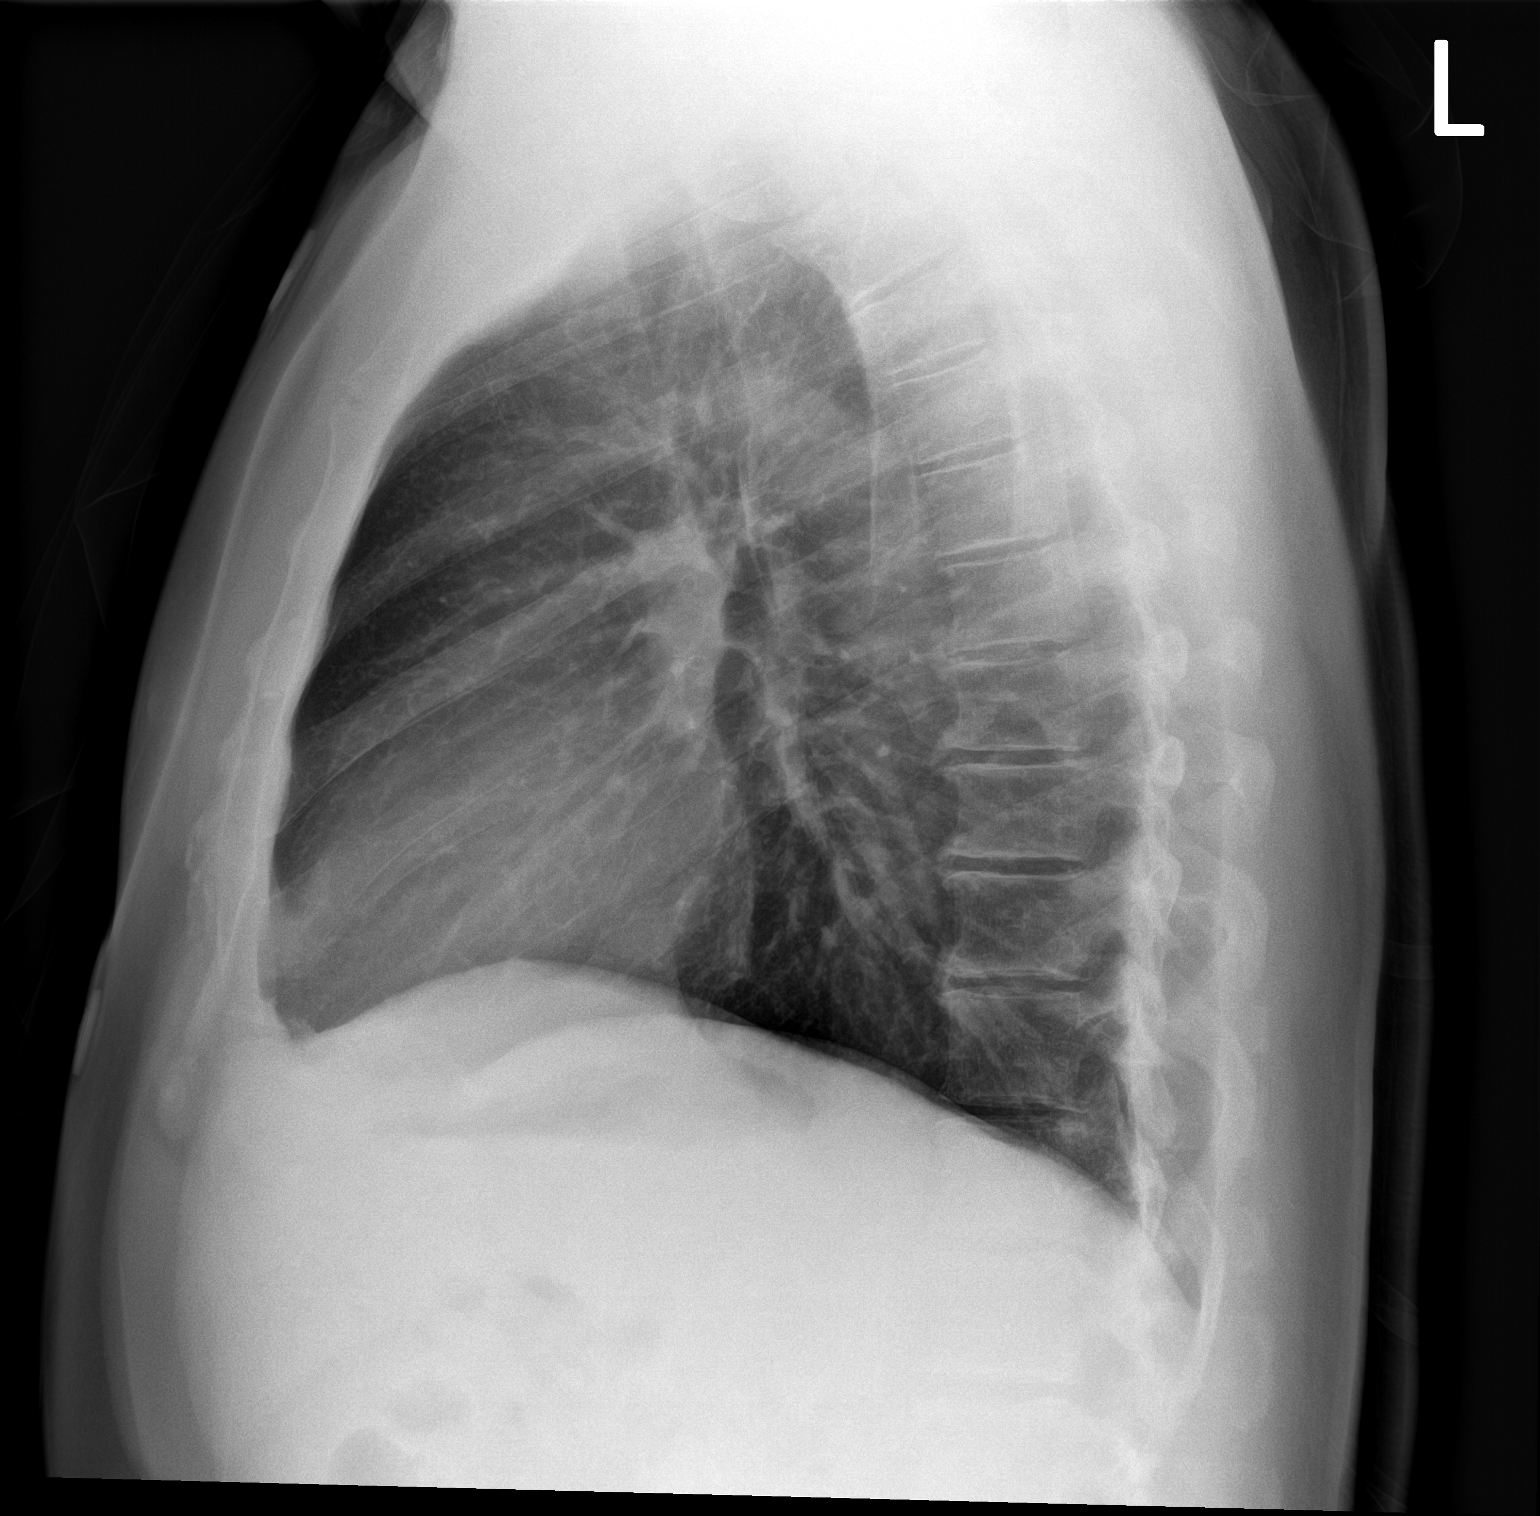

[2 of 2 positions shown; findings below may reference images not displayed]

FINDINGS: The cardiomediastinal contours are within normal limits. The lungs
are clear. No pneumothorax or pleural effusion. No acute finding in
the visualized skeleton.
IMPRESSION: No active cardiopulmonary disease.

## 2023-12-27 ENCOUNTER — Other Ambulatory Visit: Payer: Self-pay | Admitting: Family Medicine

## 2024-01-09 ENCOUNTER — Encounter: Payer: Self-pay | Admitting: Family Medicine
